# Patient Record
Sex: Female | Born: 1995 | Race: White | Hispanic: No | Marital: Married | Smoking: Former smoker
Health system: Southern US, Community
[De-identification: ages and names within clinical notes are randomized; demographics above are authoritative.]

## PROBLEM LIST (undated history)

## (undated) DIAGNOSIS — R519 Headache, unspecified: Secondary | ICD-10-CM

## (undated) DIAGNOSIS — M549 Dorsalgia, unspecified: Secondary | ICD-10-CM

## (undated) DIAGNOSIS — L309 Dermatitis, unspecified: Secondary | ICD-10-CM

## (undated) DIAGNOSIS — F419 Anxiety disorder, unspecified: Secondary | ICD-10-CM

## (undated) DIAGNOSIS — R748 Abnormal levels of other serum enzymes: Secondary | ICD-10-CM

## (undated) DIAGNOSIS — B999 Unspecified infectious disease: Secondary | ICD-10-CM

## (undated) DIAGNOSIS — F410 Panic disorder [episodic paroxysmal anxiety] without agoraphobia: Secondary | ICD-10-CM

## (undated) DIAGNOSIS — G43909 Migraine, unspecified, not intractable, without status migrainosus: Secondary | ICD-10-CM

## (undated) HISTORY — PX: NO PAST SURGERIES: SHX2092

---
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
## 2015-01-23 ENCOUNTER — Emergency Department (HOSPITAL_COMMUNITY): Payer: Managed Care, Other (non HMO)

## 2015-01-23 ENCOUNTER — Encounter (HOSPITAL_COMMUNITY): Payer: Self-pay | Admitting: Emergency Medicine

## 2015-01-23 ENCOUNTER — Emergency Department (HOSPITAL_COMMUNITY)
Admission: EM | Admit: 2015-01-23 | Discharge: 2015-01-23 | Disposition: A | Discharge status: Home / Self Care | Payer: Managed Care, Other (non HMO) | Attending: Emergency Medicine | Admitting: Emergency Medicine

## 2015-01-23 DIAGNOSIS — O9989 Other specified diseases and conditions complicating pregnancy, childbirth and the puerperium: Secondary | ICD-10-CM | POA: Insufficient documentation

## 2015-01-23 DIAGNOSIS — R1030 Lower abdominal pain, unspecified: Secondary | ICD-10-CM | POA: Insufficient documentation

## 2015-01-23 DIAGNOSIS — Z79899 Other long term (current) drug therapy: Secondary | ICD-10-CM | POA: Diagnosis not present

## 2015-01-23 DIAGNOSIS — R102 Pelvic and perineal pain: Secondary | ICD-10-CM

## 2015-01-23 DIAGNOSIS — J45909 Unspecified asthma, uncomplicated: Secondary | ICD-10-CM | POA: Diagnosis not present

## 2015-01-23 DIAGNOSIS — O99511 Diseases of the respiratory system complicating pregnancy, first trimester: Secondary | ICD-10-CM | POA: Diagnosis not present

## 2015-01-23 DIAGNOSIS — Z7952 Long term (current) use of systemic steroids: Secondary | ICD-10-CM | POA: Diagnosis not present

## 2015-01-23 DIAGNOSIS — R1110 Vomiting, unspecified: Secondary | ICD-10-CM

## 2015-01-23 DIAGNOSIS — Z3A01 Less than 8 weeks gestation of pregnancy: Secondary | ICD-10-CM | POA: Diagnosis not present

## 2015-01-23 DIAGNOSIS — O26899 Other specified pregnancy related conditions, unspecified trimester: Secondary | ICD-10-CM

## 2015-01-23 DIAGNOSIS — O219 Vomiting of pregnancy, unspecified: Secondary | ICD-10-CM | POA: Insufficient documentation

## 2015-01-23 DIAGNOSIS — Z880 Allergy status to penicillin: Secondary | ICD-10-CM | POA: Diagnosis not present

## 2015-01-23 LAB — COMPREHENSIVE METABOLIC PANEL
ALBUMIN: 4 g/dL (ref 3.5–5.0)
ALT: 21 U/L (ref 14–54)
ANION GAP: 8 (ref 5–15)
AST: 20 U/L (ref 15–41)
Alkaline Phosphatase: 76 U/L (ref 38–126)
BILIRUBIN TOTAL: 0.7 mg/dL (ref 0.3–1.2)
BUN: 8 mg/dL (ref 6–20)
CO2: 22 mmol/L (ref 22–32)
Calcium: 9 mg/dL (ref 8.9–10.3)
Chloride: 106 mmol/L (ref 101–111)
Creatinine, Ser: 0.55 mg/dL (ref 0.44–1.00)
GFR calc Af Amer: >60 mL/min (ref >60)
GFR calc non Af Amer: >60 mL/min (ref >60)
GLUCOSE: 81 mg/dL (ref 65–99)
POTASSIUM: 3.8 mmol/L (ref 3.5–5.1)
SODIUM: 136 mmol/L (ref 135–145)
TOTAL PROTEIN: 7.3 g/dL (ref 6.5–8.1)

## 2015-01-23 LAB — CBC WITH DIFFERENTIAL/PLATELET
BASOS PCT: 0 %
Basophils Absolute: 0 10*3/uL (ref 0.0–0.1)
EOS ABS: 0.4 10*3/uL (ref 0.0–0.7)
EOS PCT: 4 %
HEMATOCRIT: 41 % (ref 36.0–46.0)
Hemoglobin: 14.1 g/dL (ref 12.0–15.0)
Lymphocytes Relative: 27 %
Lymphs Abs: 2.7 10*3/uL (ref 0.7–4.0)
MCH: 28.8 pg (ref 26.0–34.0)
MCHC: 34.4 g/dL (ref 30.0–36.0)
MCV: 83.8 fL (ref 78.0–100.0)
MONO ABS: 0.6 10*3/uL (ref 0.1–1.0)
MONOS PCT: 6 %
Neutro Abs: 6.2 10*3/uL (ref 1.7–7.7)
Neutrophils Relative %: 63 %
PLATELETS: 299 10*3/uL (ref 150–400)
RBC: 4.89 MIL/uL (ref 3.87–5.11)
RDW: 12.9 % (ref 11.5–15.5)
WBC: 9.9 10*3/uL (ref 4.0–10.5)

## 2015-01-23 LAB — URINALYSIS, ROUTINE W REFLEX MICROSCOPIC
Bilirubin Urine: NEGATIVE
Glucose, UA: NEGATIVE mg/dL
KETONES UR: NEGATIVE mg/dL
NITRITE: NEGATIVE
PH: 6 (ref 5.0–8.0)
PROTEIN: NEGATIVE mg/dL
Specific Gravity, Urine: 1.02 (ref 1.005–1.030)
Urobilinogen, UA: 0.2 mg/dL (ref 0.0–1.0)

## 2015-01-23 LAB — HCG, QUANTITATIVE, PREGNANCY: hCG, Beta Chain, Quant, S: 33142 m[IU]/mL — ABNORMAL HIGH (ref <5)

## 2015-01-23 LAB — URINE MICROSCOPIC-ADD ON

## 2015-01-23 LAB — PREGNANCY, URINE: Preg Test, Ur: POSITIVE — AB

## 2015-01-23 LAB — LIPASE, BLOOD: Lipase: 21 U/L (ref 11–51)

## 2015-01-23 MED ORDER — PROMETHAZINE HCL 25 MG/ML IJ SOLN
12.5000 mg | Freq: Four times a day (QID) | INTRAMUSCULAR | Status: DC | PRN
  Administered 2015-01-23: 12.5 mg via INTRAVENOUS
  Filled 2015-01-23: qty 1

## 2015-01-23 MED ORDER — SODIUM CHLORIDE 0.9 % IV BOLUS (SEPSIS)
1000.0000 mL | Freq: Once | INTRAVENOUS | Status: AC
  Administered 2015-01-23: 1000 mL via INTRAVENOUS

## 2015-01-23 MED ORDER — PRENATAL COMPLETE 14-0.4 MG PO TABS: 1.0000 | ORAL_TABLET | Freq: Every day | ORAL | Status: DC

## 2015-01-23 MED ORDER — DOXYLAMINE-PYRIDOXINE 10-10 MG PO TBEC: 2.0000 | DELAYED_RELEASE_TABLET | Freq: Every evening | ORAL | Status: DC | PRN

## 2015-01-23 NOTE — ED Notes (Signed)
Started with vomiting last Wednesday.  Vomited 2-3 times in last 24 hours.

## 2015-01-23 NOTE — ED Notes (Signed)
Pt tolerating PO fluids at this time.

## 2015-01-23 NOTE — Discharge Instructions (Signed)
Nausea and Vomiting Follow-up with the obstetrician. Take nausea medicine as prescribed. Return to the ED if you develop new or worsening symptoms. Nausea is a sick feeling that often comes before throwing up (vomiting). Vomiting is a reflex where stomach contents come out of your mouth. Vomiting can cause severe loss of body fluids (dehydration). Children and elderly adults can become dehydrated quickly, especially if they also have diarrhea. Nausea and vomiting are symptoms of a condition or disease. It is important to find the cause of your symptoms. CAUSES   Direct irritation of the stomach lining. This irritation can result from increased acid production (gastroesophageal reflux disease), infection, food poisoning, taking certain medicines (such as nonsteroidal anti-inflammatory drugs), alcohol use, or tobacco use.  Signals from the brain.These signals could be caused by a headache, heat exposure, an inner ear disturbance, increased pressure in the brain from injury, infection, a tumor, or a concussion, pain, emotional stimulus, or metabolic problems.  An obstruction in the gastrointestinal tract (bowel obstruction).  Illnesses such as diabetes, hepatitis, gallbladder problems, appendicitis, kidney problems, cancer, sepsis, atypical symptoms of a heart attack, or eating disorders.  Medical treatments such as chemotherapy and radiation.  Receiving medicine that makes you sleep (general anesthetic) during surgery. DIAGNOSIS Your caregiver may ask for tests to be done if the problems do not improve after a few days. Tests may also be done if symptoms are severe or if the reason for the nausea and vomiting is not clear. Tests may include:  Urine tests.  Blood tests.  Stool tests.  Cultures (to look for evidence of infection).  X-rays or other imaging studies. Test results can help your caregiver make decisions about treatment or the need for additional tests. TREATMENT You need to  stay well hydrated. Drink frequently but in small amounts.You may wish to drink water, sports drinks, clear broth, or eat frozen ice pops or gelatin dessert to help stay hydrated.When you eat, eating slowly may help prevent nausea.There are also some antinausea medicines that may help prevent nausea. HOME CARE INSTRUCTIONS   Take all medicine as directed by your caregiver.  If you do not have an appetite, do not force yourself to eat. However, you must continue to drink fluids.  If you have an appetite, eat a normal diet unless your caregiver tells you differently.  Eat a variety of complex carbohydrates (rice, wheat, potatoes, bread), lean meats, yogurt, fruits, and vegetables.  Avoid high-fat foods because they are more difficult to digest.  Drink enough water and fluids to keep your urine clear or pale yellow.  If you are dehydrated, ask your caregiver for specific rehydration instructions. Signs of dehydration may include:  Severe thirst.  Dry lips and mouth.  Dizziness.  Dark urine.  Decreasing urine frequency and amount.  Confusion.  Rapid breathing or pulse. SEEK IMMEDIATE MEDICAL CARE IF:   You have blood or brown flecks (like coffee grounds) in your vomit.  You have black or bloody stools.  You have a severe headache or stiff neck.  You are confused.  You have severe abdominal pain.  You have chest pain or trouble breathing.  You do not urinate at least once every 8 hours.  You develop cold or clammy skin.  You continue to vomit for longer than 24 to 48 hours.  You have a fever. MAKE SURE YOU:   Understand these instructions.  Will watch your condition.  Will get help right away if you are not doing well or get worse.  This information is not intended to replace advice given to you by your health care provider. Make sure you discuss any questions you have with your health care provider.   Document Released: 03/11/2005 Document Revised:  06/03/2011 Document Reviewed: 08/08/2010 Elsevier Interactive Patient Education [employer]2016 [employer] Inc.

## 2015-01-23 NOTE — ED Provider Notes (Signed)
CSN: [id number]     Arrival date & time 01/23/15  1138 History   First MD Initiated Contact with Patient 01/23/15 1456     Chief Complaint  Patient presents with  . Emesis     (Consider location/radiation/quality/duration/timing/severity/associated sxs/prior Treatment) HPI Comments: Patient from home with 3 day history of nausea and vomiting morning. States she vomits 1-2 times in the morning and then feels better throughout the day but has a poor appetite. Last episode of vomiting was this morning. Associated with intermittent lower abdominal cramps. No diarrhea or fever. No urinary symptoms. No vaginal bleeding. Last menstrual period September 24. She is not certain if she is pregnant. No previous abdominal surgeries. No sick contacts or recent travel. No chest pain or shortness of breath.  Patient is a 19 y.o. female presenting with vomiting. The history is provided by the patient and a relative.  Emesis Associated symptoms: abdominal pain   Associated symptoms: no arthralgias, no headaches and no myalgias     Past Medical History  Diagnosis Date  . Asthma    History reviewed. No pertinent past surgical history. History reviewed. No pertinent family history. Social History  Substance Use Topics  . Smoking status: Never Smoker   . Smokeless tobacco: None  . Alcohol Use: No   OB History    No data available     Review of Systems  Constitutional: Positive for activity change and appetite change. Negative for fever.  HENT: Negative for congestion.   Eyes: Negative for visual disturbance.  Respiratory: Negative for cough, chest tightness and shortness of breath.   Gastrointestinal: Positive for nausea, vomiting and abdominal pain.  Genitourinary: Negative for dysuria, hematuria, vaginal bleeding and vaginal discharge.  Musculoskeletal: Negative for myalgias and arthralgias.  Skin: Negative for rash.  Neurological: Negative for dizziness, weakness and headaches.  A complete  10 system review of systems was obtained and all systems are negative except as noted in the HPI and PMH.      Allergies  Penicillins  Home Medications   Prior to Admission medications   Medication Sig Start Date End Date Taking? Authorizing Provider  albuterol (PROVENTIL,VENTOLIN) 90 MCG/ACT inhaler Inhale 1-4 puffs into the lungs every 6 (six) hours as needed. Shortness of breath and wheezing     Yes Historical Provider, MD  beclomethasone (QVAR) 80 MCG/ACT inhaler Inhale 1 puff into the lungs daily.   Yes Historical Provider, MD  flintstones complete (FLINTSTONES) 60 MG chewable tablet Chew 1 tablet by mouth daily.   Yes Historical Provider, MD  norelgestromin-ethinyl estradiol (ORTHO EVRA) 150-35 MCG/24HR transdermal patch Place 1 patch onto the skin once a week.   Yes Historical Provider, MD  azithromycin (ZITHROMAX Z-PAK) 250 MG tablet 2 tablets 1st day then 1 a day Patient not taking: Reported on 01/23/2015 07/05/12   Elson AreasLeslie K Sofia, PA-C  Doxylamine-Pyridoxine (DICLEGIS) 10-10 MG TBEC Take 2 tablets by mouth at bedtime as needed. 01/23/15   Glynn OctaveStephen Andrej Spagnoli, MD  naproxen (NAPROSYN) 500 MG tablet Take 1 tablet (500 mg total) by mouth 2 (two) times daily. Patient not taking: Reported on 01/23/2015 09/04/14   Janne NapoleonHope M Neese, NP  Prenatal Vit-Fe Fumarate-FA (PRENATAL COMPLETE) 14-0.4 MG TABS Take 1 tablet by mouth daily. 01/23/15   Glynn OctaveStephen Quinley Nesler, MD   BP 96/65 mmHg  Pulse 68  Temp(Src) 98.2 F (36.8 C) (Oral)  Resp 18  Ht 5\' 2"  (1.575 m)  Wt 147 lb 9 oz (66.934 kg)  BMI 26.98 kg/m2  SpO2 100%  LMP 12/18/2014 Physical Exam  Constitutional: She is oriented to person, place, and time. She appears well-developed and well-nourished. No distress.  Well appearing  HENT:  Head: Normocephalic and atraumatic.  Mouth/Throat: Oropharynx is clear and moist. No oropharyngeal exudate.  Eyes: Conjunctivae and EOM are normal. Pupils are equal, round, and reactive to light.  Neck: Normal  range of motion. Neck supple.  No meningismus.  Cardiovascular: Normal rate, regular rhythm, normal heart sounds and intact distal pulses.   No murmur heard. Pulmonary/Chest: Effort normal and breath sounds normal. No respiratory distress.  Abdominal: Soft. There is tenderness. There is no rebound and no guarding.  Mild lower Abdominal tenderness  Musculoskeletal: Normal range of motion. She exhibits no edema or tenderness.  Neurological: She is alert and oriented to person, place, and time. No cranial nerve deficit. She exhibits normal muscle tone. Coordination normal.  No ataxia on finger to nose bilaterally. No pronator drift. 5/5 strength throughout. CN 2-12 intact. Negative Romberg. Equal grip strength. Sensation intact. Gait is normal.   Skin: Skin is warm.  Psychiatric: She has a normal mood and affect. Her behavior is normal.  Nursing note and vitals reviewed.   ED Course  Procedures (including critical care time) Labs Review Labs Reviewed  URINALYSIS, ROUTINE W REFLEX MICROSCOPIC (NOT AT [facility]) - Abnormal; Notable for the following:    Hgb urine dipstick SMALL (*)    Leukocytes, UA TRACE (*)    All other components within normal limits  PREGNANCY, URINE - Abnormal; Notable for the following:    Preg Test, Ur POSITIVE (*)    All other components within normal limits  URINE MICROSCOPIC-ADD ON - Abnormal; Notable for the following:    Squamous Epithelial / LPF MANY (*)    All other components within normal limits  HCG, QUANTITATIVE, PREGNANCY - Abnormal; Notable for the following:    hCG, Beta Chain, Quant, S 33142 (*)    All other components within normal limits  URINE CULTURE  CBC WITH DIFFERENTIAL/PLATELET  COMPREHENSIVE METABOLIC PANEL  LIPASE, BLOOD    Imaging Review US Ob Comp Less 14 Wks  01/23/2015  CLINICAL DATA:  Pelvic pain and vomiting for the past 5 days. Positive urine pregnancy test. EXAM: OBSTETRIC <14 WK [place] AND TRANSVAGINAL OB US TECHNIQUE: Both  transabdominal and transvaginal ultrasound examinations were performed for complete evaluation of the gestation as well as the maternal uterus, adnexal regions, and pelvic cul-de-sac. Transvaginal technique was performed to assess early pregnancy. COMPARISON:  None. FINDINGS: Intrauterine gestational sac: Visualized/normal in shape. Yolk sac:  Visualized/ normal in shape. Embryo:  Visualized Cardiac Activity: Visualized Heart Rate: 162  bpm CRL:  3.3  mm   6 w   0 d                  [place] EDC: 09/18/2015 Maternal uterus/adnexae: No subchorionic hemorrhage. Normal appearing ovaries. No free peritoneal fluid. IMPRESSION: Single live intrauterine gestation with an estimated gestational age of [redacted] weeks and 0 days. No complicating features. Electronically Signed   By: Beckie Salts M.D.   On: 01/23/2015 16:24   US Ob Transvaginal  01/23/2015  CLINICAL DATA:  Pelvic pain and vomiting for the past 5 days. Positive urine pregnancy test. EXAM: OBSTETRIC <14 WK [place] AND TRANSVAGINAL OB US TECHNIQUE: Both transabdominal and transvaginal ultrasound examinations were performed for complete evaluation of the gestation as well as the maternal uterus, adnexal regions, and pelvic cul-de-sac. Transvaginal technique was performed to assess early pregnancy. COMPARISON:  None. FINDINGS: Intrauterine gestational sac: Visualized/normal in shape. Yolk sac:  Visualized/ normal in shape. Embryo:  Visualized Cardiac Activity: Visualized Heart Rate: 162  bpm CRL:  3.3  mm   6 w   0 d                  [place] EDC: 09/18/2015 Maternal uterus/adnexae: No subchorionic hemorrhage. Normal appearing ovaries. No free peritoneal fluid. IMPRESSION: Single live intrauterine gestation with an estimated gestational age of [redacted] weeks and 0 days. No complicating features. Electronically Signed   By: Beckie Salts M.D.   On: 01/23/2015 16:24   I have personally reviewed and evaluated these images and lab results as part of my medical decision-making.   EKG  Interpretation None      MDM   Final diagnoses:  Nausea and vomiting during pregnancy   Nausea and vomiting with minimal lower abdominal cramping.  Found to be pregnant.  No abdominal tenderness now.  UA appears contaminated with squamous cells. Labs are within normal limits.electrolytes are normal. Patient given IV fluids and antiemetics.  Ultrasound shows IUP at [redacted] weeks gestation without complicating features. Patient with no vomiting in the ED. She is tolerating by mouth. Needs to establish care with OB. Start prenatal vitamin and antiemetics. Return precautions discussed.    Glynn Octave, MD 01/24/15 [phone]

## 2015-01-24 LAB — URINE CULTURE

## 2015-02-14 LAB — OB RESULTS CONSOLE GC/CHLAMYDIA: Chlamydia: NEGATIVE

## 2015-02-14 LAB — OB RESULTS CONSOLE RPR: RPR: NONREACTIVE

## 2015-02-14 LAB — OB RESULTS CONSOLE ANTIBODY SCREEN: Antibody Screen: NEGATIVE

## 2015-02-14 LAB — OB RESULTS CONSOLE ABO/RH: RH Type: NEGATIVE

## 2015-02-14 LAB — OB RESULTS CONSOLE HEPATITIS B SURFACE ANTIGEN: Hepatitis B Surface Ag: NEGATIVE

## 2015-02-14 LAB — OB RESULTS CONSOLE RUBELLA ANTIBODY, IGM: Rubella: IMMUNE

## 2015-02-14 LAB — OB RESULTS CONSOLE GBS: STREP GROUP B AG: POSITIVE

## 2015-02-14 LAB — OB RESULTS CONSOLE HIV ANTIBODY (ROUTINE TESTING): HIV: NONREACTIVE

## 2015-03-26 NOTE — L&D Delivery Note (Signed)
Delivery Note At 3:57 PM a viable female was delivered via Vaginal, Spontaneous Delivery (Presentation: Middle Occiput Posterior).  APGAR: 4, 9; weight 7 lb 0.5 oz (3190 g).   Placenta status: Intact, Spontaneous.  Cord: 3 vessels with the following complications: None.  Cord pH: sent/pending Tight nuchal cord X 1 , clapmed + cut, ambu bag/O2 resp assist until neo arrived>>AP 4/9  Anesthesia: Epidural  Episiotomy: None Lacerations: 2nd degree, rectum + sphincter intact Suture Repair: 3.0 vicryl rapide Est. Blood Loss (mL):  350  Mom to postpartum.  Baby to Couplet care / Skin to Skin.  Meriel PicaHOLLAND,Jeraldin Fesler M 09/18/2015, 4:24 PM

## 2015-09-15 ENCOUNTER — Inpatient Hospital Stay (HOSPITAL_COMMUNITY): Payer: Self-pay

## 2015-09-15 ENCOUNTER — Inpatient Hospital Stay (HOSPITAL_COMMUNITY): Admission: AD | Admit: 2015-09-15 | Payer: Self-pay | Source: Ambulatory Visit | Admitting: Obstetrics & Gynecology

## 2015-09-18 ENCOUNTER — Encounter (HOSPITAL_COMMUNITY): Payer: Self-pay

## 2015-09-18 ENCOUNTER — Inpatient Hospital Stay (HOSPITAL_COMMUNITY)
Admission: RE | Admit: 2015-09-18 | Discharge: 2015-09-20 | DRG: 775 | Disposition: A | Discharge status: Home / Self Care | Payer: Managed Care, Other (non HMO) | Source: Ambulatory Visit | Attending: Obstetrics and Gynecology | Admitting: Obstetrics and Gynecology

## 2015-09-18 ENCOUNTER — Inpatient Hospital Stay (HOSPITAL_COMMUNITY): Payer: Managed Care, Other (non HMO) | Admitting: Anesthesiology

## 2015-09-18 ENCOUNTER — Inpatient Hospital Stay (HOSPITAL_COMMUNITY): Payer: Managed Care, Other (non HMO)

## 2015-09-18 DIAGNOSIS — Z3490 Encounter for supervision of normal pregnancy, unspecified, unspecified trimester: Secondary | ICD-10-CM

## 2015-09-18 DIAGNOSIS — O329XX0 Maternal care for malpresentation of fetus, unspecified, not applicable or unspecified: Secondary | ICD-10-CM

## 2015-09-18 DIAGNOSIS — J45909 Unspecified asthma, uncomplicated: Secondary | ICD-10-CM | POA: Diagnosis present

## 2015-09-18 LAB — CBC
HEMATOCRIT: 34.5 % — AB (ref 36.0–46.0)
Hemoglobin: 11.7 g/dL — ABNORMAL LOW (ref 12.0–15.0)
MCH: 27.6 pg (ref 26.0–34.0)
MCHC: 33.9 g/dL (ref 30.0–36.0)
MCV: 81.4 fL (ref 78.0–100.0)
Platelets: 206 10*3/uL (ref 150–400)
RBC: 4.24 MIL/uL (ref 3.87–5.11)
RDW: 14.9 % (ref 11.5–15.5)
WBC: 8.6 10*3/uL (ref 4.0–10.5)

## 2015-09-18 LAB — RPR: RPR: NONREACTIVE

## 2015-09-18 MED ORDER — MEASLES, MUMPS & RUBELLA VAC ~~LOC~~ INJ: 0.5000 mL | INJECTION | Freq: Once | SUBCUTANEOUS | Status: DC

## 2015-09-18 MED ORDER — SENNOSIDES-DOCUSATE SODIUM 8.6-50 MG PO TABS: 2.0000 | ORAL_TABLET | ORAL | Status: DC

## 2015-09-18 MED ORDER — EPHEDRINE 5 MG/ML INJ
10.0000 mg | INTRAVENOUS | Status: DC | PRN
  Filled 2015-09-18: qty 2

## 2015-09-18 MED ORDER — BUTORPHANOL TARTRATE 1 MG/ML IJ SOLN
2.0000 mg | Freq: Once | INTRAMUSCULAR | Status: AC
  Administered 2015-09-18: 2 mg via INTRAVENOUS
  Filled 2015-09-18: qty 2

## 2015-09-18 MED ORDER — ZOLPIDEM TARTRATE 5 MG PO TABS: 5.0000 mg | ORAL_TABLET | Freq: Every evening | ORAL | Status: DC | PRN

## 2015-09-18 MED ORDER — DIPHENHYDRAMINE HCL 25 MG PO CAPS: 25.0000 mg | ORAL_CAPSULE | Freq: Four times a day (QID) | ORAL | Status: DC | PRN

## 2015-09-18 MED ORDER — ONDANSETRON HCL 4 MG/2ML IJ SOLN
4.0000 mg | Freq: Once | INTRAMUSCULAR | Status: DC
  Filled 2015-09-18: qty 2

## 2015-09-18 MED ORDER — LACTATED RINGERS IV SOLN: 500.0000 mL | Freq: Once | INTRAVENOUS | Status: DC

## 2015-09-18 MED ORDER — BUPIVACAINE HCL (PF) 0.25 % IJ SOLN
INTRAMUSCULAR | Status: DC | PRN
  Administered 2015-09-18 (×2): 4 mL via EPIDURAL

## 2015-09-18 MED ORDER — ACETAMINOPHEN 325 MG PO TABS: 650.0000 mg | ORAL_TABLET | ORAL | Status: DC | PRN

## 2015-09-18 MED ORDER — BENZOCAINE-MENTHOL 20-0.5 % EX AERO
1.0000 "application " | INHALATION_SPRAY | CUTANEOUS | Status: DC | PRN
  Administered 2015-09-18 – 2015-09-20 (×2): 1 via TOPICAL
  Filled 2015-09-18 (×2): qty 56

## 2015-09-18 MED ORDER — OXYTOCIN BOLUS FROM INFUSION: 500.0000 mL | INTRAVENOUS | Status: DC

## 2015-09-18 MED ORDER — LACTATED RINGERS IV SOLN
INTRAVENOUS | Status: DC
  Administered 2015-09-18: 01:00:00 via INTRAVENOUS

## 2015-09-18 MED ORDER — LIDOCAINE HCL (PF) 1 % IJ SOLN
30.0000 mL | INTRAMUSCULAR | Status: DC | PRN
  Filled 2015-09-18: qty 30

## 2015-09-18 MED ORDER — SIMETHICONE 80 MG PO CHEW: 80.0000 mg | CHEWABLE_TABLET | ORAL | Status: DC | PRN

## 2015-09-18 MED ORDER — DEXTROSE IN LACTATED RINGERS 5 % IV SOLN: INTRAVENOUS | Status: DC

## 2015-09-18 MED ORDER — ONDANSETRON HCL 4 MG/2ML IJ SOLN: 4.0000 mg | INTRAMUSCULAR | Status: DC | PRN

## 2015-09-18 MED ORDER — OXYCODONE-ACETAMINOPHEN 5-325 MG PO TABS: 2.0000 | ORAL_TABLET | ORAL | Status: DC | PRN

## 2015-09-18 MED ORDER — OXYCODONE-ACETAMINOPHEN 5-325 MG PO TABS: 1.0000 | ORAL_TABLET | ORAL | Status: DC | PRN

## 2015-09-18 MED ORDER — PHENYLEPHRINE 40 MCG/ML (10ML) SYRINGE FOR IV PUSH (FOR BLOOD PRESSURE SUPPORT)
80.0000 ug | PREFILLED_SYRINGE | INTRAVENOUS | Status: DC | PRN
  Administered 2015-09-18: 40 ug via INTRAVENOUS
  Filled 2015-09-18: qty 10
  Filled 2015-09-18: qty 5

## 2015-09-18 MED ORDER — COCONUT OIL OIL: 1.0000 "application " | TOPICAL_OIL | Status: DC | PRN

## 2015-09-18 MED ORDER — FLEET ENEMA 7-19 GM/118ML RE ENEM: 1.0000 | ENEMA | Freq: Every day | RECTAL | Status: DC | PRN

## 2015-09-18 MED ORDER — SOD CITRATE-CITRIC ACID 500-334 MG/5ML PO SOLN: 30.0000 mL | ORAL | Status: DC | PRN

## 2015-09-18 MED ORDER — TERBUTALINE SULFATE 1 MG/ML IJ SOLN
0.2500 mg | Freq: Once | INTRAMUSCULAR | Status: DC | PRN
  Filled 2015-09-18: qty 1

## 2015-09-18 MED ORDER — LACTATED RINGERS IV SOLN
500.0000 mL | INTRAVENOUS | Status: DC | PRN
  Administered 2015-09-18: 1000 mL via INTRAVENOUS

## 2015-09-18 MED ORDER — OXYCODONE-ACETAMINOPHEN 5-325 MG PO TABS
1.0000 | ORAL_TABLET | ORAL | Status: DC | PRN
Start: 2015-09-18 — End: 2015-09-18

## 2015-09-18 MED ORDER — DIBUCAINE 1 % RE OINT: 1.0000 "application " | TOPICAL_OINTMENT | RECTAL | Status: DC | PRN

## 2015-09-18 MED ORDER — TERBUTALINE SULFATE 1 MG/ML IJ SOLN: 0.2500 mg | Freq: Once | INTRAMUSCULAR | Status: DC | PRN

## 2015-09-18 MED ORDER — VANCOMYCIN HCL IN DEXTROSE 1-5 GM/200ML-% IV SOLN
1000.0000 mg | Freq: Two times a day (BID) | INTRAVENOUS | Status: DC
  Administered 2015-09-18: 1000 mg via INTRAVENOUS
  Filled 2015-09-18 (×2): qty 200

## 2015-09-18 MED ORDER — MISOPROSTOL 25 MCG QUARTER TABLET
25.0000 ug | ORAL_TABLET | ORAL | Status: DC | PRN
  Administered 2015-09-18: 25 ug via VAGINAL
  Filled 2015-09-18: qty 1
  Filled 2015-09-18: qty 0.25

## 2015-09-18 MED ORDER — TETANUS-DIPHTH-ACELL PERTUSSIS 5-2.5-18.5 LF-MCG/0.5 IM SUSP: 0.5000 mL | Freq: Once | INTRAMUSCULAR | Status: DC

## 2015-09-18 MED ORDER — WITCH HAZEL-GLYCERIN EX PADS: 1.0000 "application " | MEDICATED_PAD | CUTANEOUS | Status: DC | PRN

## 2015-09-18 MED ORDER — PHENYLEPHRINE 40 MCG/ML (10ML) SYRINGE FOR IV PUSH (FOR BLOOD PRESSURE SUPPORT)
80.0000 ug | PREFILLED_SYRINGE | INTRAVENOUS | Status: DC | PRN
  Filled 2015-09-18: qty 5

## 2015-09-18 MED ORDER — FLEET ENEMA 7-19 GM/118ML RE ENEM: 1.0000 | ENEMA | RECTAL | Status: DC | PRN

## 2015-09-18 MED ORDER — BUDESONIDE 0.25 MG/2ML IN SUSP: 0.2500 mg | RESPIRATORY_TRACT | Status: DC | PRN

## 2015-09-18 MED ORDER — ALBUTEROL SULFATE (2.5 MG/3ML) 0.083% IN NEBU: 3.0000 mL | INHALATION_SOLUTION | Freq: Four times a day (QID) | RESPIRATORY_TRACT | Status: DC | PRN

## 2015-09-18 MED ORDER — ONDANSETRON HCL 4 MG PO TABS: 4.0000 mg | ORAL_TABLET | ORAL | Status: DC | PRN

## 2015-09-18 MED ORDER — OXYTOCIN 40 UNITS IN LACTATED RINGERS INFUSION - SIMPLE MED
2.5000 [IU]/h | INTRAVENOUS | Status: DC
  Administered 2015-09-18: 39.96 [IU]/h via INTRAVENOUS
  Filled 2015-09-18: qty 1000

## 2015-09-18 MED ORDER — DIPHENHYDRAMINE HCL 50 MG/ML IJ SOLN: 12.5000 mg | INTRAMUSCULAR | Status: DC | PRN

## 2015-09-18 MED ORDER — LIDOCAINE-EPINEPHRINE (PF) 1.5 %-1:200000 IJ SOLN
INTRAMUSCULAR | Status: DC | PRN
  Administered 2015-09-18: 3 mL via EPIDURAL

## 2015-09-18 MED ORDER — FENTANYL 2.5 MCG/ML BUPIVACAINE 1/10 % EPIDURAL INFUSION (WH - ANES)
14.0000 mL/h | INTRAMUSCULAR | Status: DC | PRN
  Administered 2015-09-18: 12 mL/h via EPIDURAL
  Administered 2015-09-18: 14 mL/h via EPIDURAL
  Filled 2015-09-18: qty 125

## 2015-09-18 MED ORDER — BISACODYL 10 MG RE SUPP: 10.0000 mg | Freq: Every day | RECTAL | Status: DC | PRN

## 2015-09-18 MED ORDER — IBUPROFEN 800 MG PO TABS
800.0000 mg | ORAL_TABLET | Freq: Three times a day (TID) | ORAL | Status: DC | PRN
  Administered 2015-09-18 – 2015-09-19 (×4): 800 mg via ORAL
  Filled 2015-09-18 (×4): qty 1

## 2015-09-18 MED ORDER — PRENATAL MULTIVITAMIN CH
1.0000 | ORAL_TABLET | Freq: Every day | ORAL | Status: DC
  Administered 2015-09-19 – 2015-09-20 (×2): 1 via ORAL
  Filled 2015-09-18 (×2): qty 1

## 2015-09-18 NOTE — Progress Notes (Signed)
Consulted MD pertaining to patient pain and positioning of fetus. Per MD pt able to receive Stadol 2mg . Per MD hold second dose of Cytotec due to uncertainty of positioning of fetus. Pt possible ROM at 0530 MD aware.

## 2015-09-18 NOTE — Anesthesia Pain Management Evaluation Note (Signed)
  CRNA Pain Management Visit Note  Patient: Nichole Curtis, 20 y.o., female  "Hello I am a [facility] of the anesthesia team at [facility]. We have an anesthesia team available at all times to provide care throughout the hospital, including epidural management and anesthesia for C-section. I don't know your plan for the delivery whether it a natural birth, water birth, IV sedation, nitrous supplementation, doula or epidural, but we want to meet your pain goals."   1.Was your pain managed to your expectations on prior hospitalizations?   No prior hospitalizations  2.What is your expectation for pain management during this hospitalization?     IV pain meds  3.How can we help you reach that goal? IV Pain Rx   Record the patient's initial score and the patient's pain goal.   Pain: 2  Pain Goal: 6 [facility] wants you to be able to say your pain was always managed very well.  Janiqua Friscia 09/18/2015

## 2015-09-18 NOTE — Anesthesia Procedure Notes (Signed)
Epidural Patient location during procedure: OB  Staffing Anesthesiologist: Sopheap Boehle Performed by: anesthesiologist   Preanesthetic Checklist Completed: patient identified, surgical consent, pre-op evaluation, timeout performed, IV checked, risks and benefits discussed and monitors and equipment checked  Epidural Patient position: sitting Prep: DuraPrep Patient monitoring: heart rate, cardiac monitor, continuous pulse ox and blood pressure Approach: midline Location: L3-L4 Injection technique: LOR saline  Needle:  Needle type: Tuohy  Needle gauge: 17 G Needle length: 9 cm Needle insertion depth: 6 cm Catheter type: closed end flexible Catheter size: 19 Gauge Catheter at skin depth: 12 cm Test dose: negative and 1.5% lidocaine with Epi 1:200 K  Assessment Events: blood not aspirated, injection not painful, no injection resistance, negative IV test and no paresthesia  Additional Notes Reason for block:procedure for pain

## 2015-09-18 NOTE — H&P (Signed)
Nichole Curtis is a 20 y.o. female presenting for IOL. Maternal Medical History:  Contractions: Onset was 1-2 hours ago.   Frequency: regular.   Perceived severity is moderate.    Fetal activity: Perceived fetal activity is normal.      OB History    Gravida Para Term Preterm AB TAB SAB Ectopic Multiple Living   1              Past Medical History  Diagnosis Date  . Asthma    No past surgical history on file. Family History: family history is not on file. Social History:  reports that she has never smoked. She does not have any smokeless tobacco history on file. She reports that she does not drink alcohol or use illicit drugs.   Prenatal Transfer Tool  Maternal Diabetes: No Genetic Screening: Normal Maternal Ultrasounds/Referrals: Normal Fetal Ultrasounds or other Referrals:  None Maternal Substance Abuse:  No Significant Maternal Medications:  None Significant Maternal Lab Results:  None Other Comments:  None  ROS  Dilation: 2.5 Effacement (%): 80 Station: -2 Exam by:: D Herr rn Blood pressure 142/81, pulse 50, temperature 97.8 F (36.6 C), temperature source Oral, resp. rate 20, height 5\' 2"  (1.575 m), weight 180 lb (81.647 kg), last menstrual period 12/18/2014, SpO2 97 %. Exam Physical Exam  Constitutional: She appears well-developed and well-nourished.  HENT:  Head: Normocephalic and atraumatic.  Neck: Normal range of motion. Neck supple.  Cardiovascular: Normal rate and regular rhythm.   Respiratory: Effort normal and breath sounds normal.  GI:  Term FH, FHR 142  Genitourinary:  3/C/vtx/ SROM  Musculoskeletal: Normal range of motion.  Skin: Skin is warm and dry.    Prenatal labs: ABO, Rh: --/--/O NEG (06/26 0115) Antibody: POS (06/26 0115) Rubella: Immune (11/22 0000) RPR: Nonreactive (11/22 0000)  HBsAg: Negative (11/22 0000)  HIV: Non-reactive (11/22 0000)  GBS: Positive (11/22 0000)   Assessment/Plan: Term preg, for IOL ABX for  GBS   Nichole Curtis M 09/18/2015, 8:06 AM

## 2015-09-18 NOTE — Progress Notes (Signed)
U/S in room , vertex position, occiput posterior

## 2015-09-18 NOTE — Anesthesia Preprocedure Evaluation (Signed)
Anesthesia Evaluation  Patient identified by MRN, date of birth, ID band Patient awake    Reviewed: Allergy & Precautions, Patient's Chart, lab work & pertinent test results  History of Anesthesia Complications Negative for: history of anesthetic complications  Airway Mallampati: II  TM Distance: >3 FB Neck ROM: Full    Dental  (+) Teeth Intact   Pulmonary asthma ,    breath sounds clear to auscultation- rhonchi       Cardiovascular negative cardio ROS   Rhythm:Regular     Neuro/Psych negative neurological ROS  negative psych ROS   GI/Hepatic negative GI ROS, Neg liver ROS,   Endo/Other  negative endocrine ROS  Renal/GU negative Renal ROS     Musculoskeletal   Abdominal   Peds  Hematology negative hematology ROS (+)   Anesthesia Other Findings   Reproductive/Obstetrics (+) Pregnancy                             Anesthesia Physical Anesthesia Plan  ASA: II  Anesthesia Plan: Epidural   Post-op Pain Management:    Induction:   Airway Management Planned:   Additional Equipment:   Intra-op Plan:   Post-operative Plan:   Informed Consent: I have reviewed the patients History and Physical, chart, labs and discussed the procedure including the risks, benefits and alternatives for the proposed anesthesia with the patient or authorized representative who has indicated his/her understanding and acceptance.     Plan Discussed with: Anesthesiologist  Anesthesia Plan Comments:         Anesthesia Quick Evaluation

## 2015-09-19 ENCOUNTER — Encounter (HOSPITAL_COMMUNITY): Payer: Self-pay

## 2015-09-19 LAB — CBC
HEMATOCRIT: 30.3 % — AB (ref 36.0–46.0)
Hemoglobin: 10 g/dL — ABNORMAL LOW (ref 12.0–15.0)
MCH: 26.7 pg (ref 26.0–34.0)
MCHC: 33 g/dL (ref 30.0–36.0)
MCV: 81 fL (ref 78.0–100.0)
Platelets: 167 10*3/uL (ref 150–400)
RBC: 3.74 MIL/uL — AB (ref 3.87–5.11)
RDW: 15 % (ref 11.5–15.5)
WBC: 9.8 10*3/uL (ref 4.0–10.5)

## 2015-09-19 MED ORDER — PNEUMOCOCCAL VAC POLYVALENT 25 MCG/0.5ML IJ INJ
0.5000 mL | INJECTION | INTRAMUSCULAR | Status: DC
  Filled 2015-09-19: qty 0.5

## 2015-09-19 NOTE — Anesthesia Postprocedure Evaluation (Signed)
Anesthesia Post Note  Patient: Minda DittoMegan N Vanrheen  Procedure(s) Performed: * No procedures listed *  Patient location during evaluation: Mother Baby Anesthesia Type: Epidural Level of consciousness: awake, awake and alert and oriented Pain management: pain level controlled Vital Signs Assessment: post-procedure vital signs reviewed and stable Respiratory status: spontaneous breathing, nonlabored ventilation and respiratory function stable Cardiovascular status: stable Postop Assessment: no headache, no backache, patient able to bend at knees, no signs of nausea or vomiting and adequate PO intake Anesthetic complications: no     Last Vitals:  Filed Vitals:   09/18/15 2232 09/19/15 0718  BP: 128/70 115/55  Pulse: 83 82  Temp: 36.9 C 36.8 C  Resp: 18 18    Last Pain:  Filed Vitals:   09/19/15 1038  PainSc: 0-No pain   Pain Goal: Patients Stated Pain Goal: 3 (09/19/15 0451)               Deairra Halleck

## 2015-09-19 NOTE — Progress Notes (Signed)
Post Partum Day 1 Subjective: no complaints, up ad lib, voiding and tolerating PO  Objective: Blood pressure 115/55, pulse 82, temperature 98.3 F (36.8 C), temperature source Oral, resp. rate 18, height 5\' 2"  (1.575 m), weight 180 lb (81.647 kg), last menstrual period 12/18/2014, SpO2 97 %, unknown if currently breastfeeding.  Physical Exam:  General: alert and cooperative Lochia: appropriate Uterine Fundus: firm Incision: healing well, labial edema noted L> R DVT Evaluation: No evidence of DVT seen on physical exam. Negative Homan's sign. No cords or calf tenderness.   Recent Labs  09/18/15 0115 09/19/15 0550  HGB 11.7* 10.0*  HCT 34.5* 30.3*    Assessment/Plan: Plan for discharge tomorrow   LOS: 1 day   Nichole Curtis G 09/19/2015, 8:01 AM

## 2015-09-19 NOTE — Lactation Note (Signed)
This note was copied from a baby's chart. Lactation Consultation Note New mom BF in side lying position. RN assisted in latch. Mom has large pendulum breast w/flat nipples. Compressible for deep latch when baby holds on to nipple. Baby actively suckling well on breast. Has good flange. Mom denies painful latch. Positioned mom to lay back away from baby w/pillows supporting her back so not to cover baby's nose. Discussed safety during BF. Hand expressed w/easy flow of colostrum. Mom reports + breast changes w/pregnancy. Shells given to wear in bra to assist everting nipples. Hand pump given to evert nipples prior to latch also for stimulation. Educated about newborn behavior, STS, I&O, cluster feeding, supply and demand. Mom encouraged to feed baby 8-12 times/24 hours and with feeding cues. Referred to Baby and Me Book in Breastfeeding section Pg. 22-23 for position options and Proper latch demonstration.[facility] brochure given w/resources, support groups and LC services. Patient Name: Nichole Vedia PereyraMegan Vanrheen ZOXWR'UToday's Date: 09/19/2015 Reason for consult: Initial assessment   Maternal Data Has patient been taught Hand Expression?: Yes Does the patient have breastfeeding experience prior to this delivery?: No  Feeding Feeding Type: Breast Fed Length of feed: 20 min  LATCH Score/Interventions Latch: Grasps breast easily, tongue down, lips flanged, rhythmical sucking. Intervention(s): Adjust position;Assist with latch;Breast massage;Breast compression  Audible Swallowing: A few with stimulation Intervention(s): Hand expression;Alternate breast massage  Type of Nipple: Flat Intervention(s): Hand pump;Shells  Comfort (Breast/Nipple): Soft / non-tender     Hold (Positioning): Assistance needed to correctly position infant at breast and maintain latch. Intervention(s): Skin to skin;Position options;Support Pillows;Breastfeeding basics reviewed  LATCH Score: 7  Lactation Tools Discussed/Used Tools:  Shells;Pump Shell Type: Inverted Breast pump type: Manual WIC Program: Yes Pump Review: Setup, frequency, and cleaning;Milk Storage Initiated by:: Peri JeffersonL. Johntay Doolen RN Date initiated:: 09/19/15   Consult Status Date: 09/19/15 (in pm) Follow-up type: In-patient    Duanne Duchesne, Diamond NickelLAURA G 09/19/2015, 2:52 AM

## 2015-09-20 MED ORDER — IBUPROFEN 800 MG PO TABS: 800.0000 mg | ORAL_TABLET | Freq: Three times a day (TID) | ORAL | Status: DC | PRN

## 2015-09-20 MED ORDER — OXYCODONE-ACETAMINOPHEN 5-325 MG PO TABS: 1.0000 | ORAL_TABLET | ORAL | Status: DC | PRN

## 2015-09-20 NOTE — Discharge Instructions (Signed)
Call MD for T>100.4, heavy vaginal bleeding, severe abdominal pain, or respiratory distress.  Call office to schedule postpartum visit in 4-6 weeks.  No driving while taking narcotics.  Pelvic rest x 6 weeks. °

## 2015-09-20 NOTE — Discharge Summary (Signed)
Obstetric Discharge Summary Reason for Admission: induction of labor Prenatal Procedures: ultrasound Intrapartum Procedures: spontaneous vaginal delivery Postpartum Procedures: none Complications-Operative and Postpartum: 2 degree perineal laceration HEMOGLOBIN  Date Value Ref Range Status  09/19/2015 10.0* 12.0 - 15.0 g/dL Final   HCT  Date Value Ref Range Status  09/19/2015 30.3* 36.0 - 46.0 % Final    Physical Exam:  General: alert and cooperative Lochia: appropriate Uterine Fundus: firm Incision: healing well DVT Evaluation: No evidence of DVT seen on physical exam. Negative Homan's sign. No cords or calf tenderness. No significant calf/ankle edema.  Discharge Diagnoses: Term Pregnancy-delivered  Discharge Information: Date: 09/20/2015 Activity: pelvic rest Diet: routine Medications: PNV and Ibuprofen Condition: stable Instructions: refer to practice specific booklet Discharge to: home   Newborn Data: Live born female  Birth Weight: 7 lb 0.5 oz (3190 g) APGAR: 4, 9  Home with mother.  CURTIS,CAROL G 09/20/2015, 8:25 AM

## 2015-09-20 NOTE — Lactation Note (Signed)
This note was copied from a baby's chart. Lactation Consultation Note    Follow up visit made prior to discharge.  Mom states baby is latching well to breast.  She has been cluster feeding this AM.  Reviewed discharge instructions including engorgement treatment.  Recommended parents keep a feeding diary of feeds and output this first week home.  Lactation outpatient services and support reviewed and encouraged.  Patient Name: Girl Vedia PereyraMegan Vanrheen ZOXWR'UToday's Date: 09/20/2015     Maternal Data    Feeding Feeding Type: Breast Fed Length of feed: 30 min  LATCH Score/Interventions                      Lactation Tools Discussed/Used     Consult Status      Huston FoleyMOULDEN, Chrsitopher Wik S 09/20/2015, 8:54 AM

## 2015-09-20 NOTE — Progress Notes (Signed)
Assumed care of mom and baby.  Mom skin to skin with baby.  Family at bedside.

## 2015-09-20 NOTE — Progress Notes (Signed)
Post Partum Day 2 Subjective: no complaints, up ad lib, voiding, tolerating PO and + flatus  Objective: Blood pressure 137/80, pulse 67, temperature 98.2 F (36.8 C), temperature source Oral, resp. rate 18, height 5\' 2"  (1.575 m), weight 180 lb (81.647 kg), last menstrual period 12/18/2014, SpO2 97 %, unknown if currently breastfeeding.  Physical Exam:  General: alert and cooperative Lochia: appropriate Uterine Fundus: firm Incision: healing well DVT Evaluation: No evidence of DVT seen on physical exam. Negative Homan's sign. No cords or calf tenderness. Calf/Ankle edema is present.   Recent Labs  09/18/15 0115 09/19/15 0550  HGB 11.7* 10.0*  HCT 34.5* 30.3*    Assessment/Plan: Discharge home, unable to complete discharge orders, admission orders have not been signed.   LOS: 2 days   CURTIS,CAROL G 09/20/2015, 8:26 AM

## 2015-09-21 LAB — TYPE AND SCREEN
ABO/RH(D): O NEG
ANTIBODY SCREEN: POSITIVE
DAT, IgG: NEGATIVE
UNIT DIVISION: 0
UNIT DIVISION: 0

## 2016-05-13 DIAGNOSIS — F418 Other specified anxiety disorders: Secondary | ICD-10-CM | POA: Insufficient documentation

## 2016-06-22 ENCOUNTER — Emergency Department (HOSPITAL_COMMUNITY)
Admission: EM | Admit: 2016-06-22 | Discharge: 2016-06-22 | Disposition: A | Discharge status: Home / Self Care | Payer: BLUE CROSS/BLUE SHIELD | Attending: Emergency Medicine | Admitting: Emergency Medicine

## 2016-06-22 ENCOUNTER — Encounter (HOSPITAL_COMMUNITY): Payer: Self-pay | Admitting: *Deleted

## 2016-06-22 DIAGNOSIS — J039 Acute tonsillitis, unspecified: Secondary | ICD-10-CM | POA: Diagnosis not present

## 2016-06-22 DIAGNOSIS — J45909 Unspecified asthma, uncomplicated: Secondary | ICD-10-CM | POA: Diagnosis not present

## 2016-06-22 DIAGNOSIS — F17210 Nicotine dependence, cigarettes, uncomplicated: Secondary | ICD-10-CM | POA: Insufficient documentation

## 2016-06-22 DIAGNOSIS — H9201 Otalgia, right ear: Secondary | ICD-10-CM | POA: Diagnosis present

## 2016-06-22 HISTORY — DX: Anxiety disorder, unspecified: F41.9

## 2016-06-22 MED ORDER — AZITHROMYCIN 250 MG PO TABS: 250.0000 mg | ORAL_TABLET | Freq: Every day | ORAL | 0 refills | Status: DC

## 2016-06-22 NOTE — ED Triage Notes (Signed)
Pt reports cough, congestion, sore throat, and right ear pain x 2 days.

## 2016-06-22 NOTE — ED Provider Notes (Signed)
[facility]-EMERGENCY DEPT Provider Note   CSN: [id number] Arrival date & time: 06/22/16  2040  By signing my name below, I, Octavia Heir, attest that this documentation has been prepared under the direction and in the presence of Ponciano Ort Champ, PA-C.  Electronically Signed: Octavia Heir, ED Scribe. 06/22/16. 10:05 PM.    History   Chief Complaint Chief Complaint  Patient presents with  . Otalgia   The history is provided by the patient. No language interpreter was used.   HPI Comments: Nichole Curtis is a 21 y.o. female who presents to the Emergency Department complaining of moderate, progressively worsening sore throat x 2 days. She reports associated right ear pain, cough, congestion, and chills. 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 She has not taken any medication to alleviate her symptoms. She denies fever.  Past Medical History:  Diagnosis Date  . Anxiety   . Asthma     Patient Active Problem List   Diagnosis Date Noted  . Pregnancy 09/18/2015    History reviewed. No pertinent surgical history.  OB History    Gravida Para Term Preterm AB Living   SAB TAB Ectopic Multiple Live Births         0 1       Home Medications    Prior to Admission medications   Medication Sig Start Date End Date Taking? Authorizing Provider  albuterol (PROVENTIL,VENTOLIN) 90 MCG/ACT inhaler Inhale 1-4 puffs into the lungs every 6 (six) hours as needed. Shortness of breath and wheezing      Historical Provider, MD  beclomethasone (QVAR) 80 MCG/ACT inhaler Inhale 1 puff into the lungs daily.    Historical Provider, MD  ibuprofen (ADVIL,MOTRIN) 800 MG tablet Take 1 tablet (800 mg total) by mouth every 8 (eight) hours as needed for moderate pain. 09/20/15   Leisel Morris, DO  oxyCODONE-acetaminophen (PERCOCET/ROXICET) 5-325 MG tablet Take 1 tablet by mouth every 4 (four) hours as needed (pain scale 4-7). 09/20/15   Mitchel Honour, DO  Prenatal  Vit-Fe Fumarate-FA (PRENATAL COMPLETE) 14-0.4 MG TABS Take 1 tablet by mouth daily. 01/23/15   Glynn Octave, MD    Family History History reviewed. No pertinent family history.  Social History Social History  Substance Use Topics  . Smoking status: Current Every Day Smoker    Types: E-cigarettes  . Smokeless tobacco: Never Used  . Alcohol use No     Allergies   Penicillins   Review of Systems Review of Systems  Constitutional: Positive for chills. Negative for fever.  HENT: Positive for congestion and sore throat.   Respiratory: Positive for cough. Negative for shortness of breath.   All other systems reviewed and are negative.    Physical Exam Updated Vital Signs BP 105/88 (BP Location: Right Arm)   Pulse 90   Temp 98.9 F (37.2 C) (Oral)   Resp 20   Ht  (1.575 m)   Wt 150 lb (68 kg)   LMP 04/24/2016   SpO2 94%   BMI 27.44 kg/m   Physical Exam  Constitutional: She is oriented to person, place, and time. She appears well-developed and well-nourished.  HENT:  Head: Normocephalic.  Erythema and enlarged bilateral tonsils   Eyes: EOM are normal.  Neck: Normal range of motion.  Pulmonary/Chest: Effort normal and breath sounds normal. No respiratory distress. She has no wheezes. She has no rales. She exhibits no tenderness.  Abdominal:  She exhibits no distension.  Musculoskeletal: Normal range of motion.  Neurological: She is alert and oriented to person, place, and time.  Psychiatric: She has a normal mood and affect.  Nursing note and vitals reviewed.    ED Treatments / Results  DIAGNOSTIC STUDIES: Oxygen Saturation is 94% on RA, low  by my interpretation.  COORDINATION OF CARE:  10:02 PM Discussed treatment plan with pt at bedside and pt agreed to plan.  Labs (all labs ordered are listed, but only abnormal results are displayed) Labs Reviewed - No data to display  EKG  EKG Interpretation None       Radiology No results  found.  Procedures Procedures (including critical care time)  Medications Ordered in ED Medications - No data to display   Initial Impression / Assessment and Plan / ED Course  I have reviewed the triage vital signs and the nursing notes.  Pertinent labs & imaging results that were available during my care of the patient were reviewed by me and considered in my medical decision making (see chart for details).       Final Clinical Impressions(s) / ED Diagnoses   Final diagnoses:  Tonsillitis   No outpatient prescriptions have been marked as taking for the 06/22/16 encounter [facility] Encounter).   An After Visit Summary was printed and given to the patient. New Prescriptions  I personally performed the services in this documentation, which was scribed in my presence.  The recorded information has been reviewed and considered.   Barnet Pall.   Lonia Skinner Crittenden, PA-C 06/22/16 2221    Samuel Jester, DO 06/23/16 0000

## 2016-06-22 NOTE — Discharge Instructions (Signed)
Return if any problems.

## 2016-11-13 ENCOUNTER — Emergency Department (HOSPITAL_COMMUNITY): Payer: BLUE CROSS/BLUE SHIELD

## 2016-11-13 ENCOUNTER — Emergency Department (HOSPITAL_COMMUNITY)
Admission: EM | Admit: 2016-11-13 | Discharge: 2016-11-13 | Disposition: A | Discharge status: Home / Self Care | Payer: BLUE CROSS/BLUE SHIELD | Attending: Emergency Medicine | Admitting: Emergency Medicine

## 2016-11-13 ENCOUNTER — Encounter (HOSPITAL_COMMUNITY): Payer: Self-pay

## 2016-11-13 DIAGNOSIS — J45909 Unspecified asthma, uncomplicated: Secondary | ICD-10-CM | POA: Insufficient documentation

## 2016-11-13 DIAGNOSIS — M545 Low back pain, unspecified: Secondary | ICD-10-CM

## 2016-11-13 DIAGNOSIS — F17210 Nicotine dependence, cigarettes, uncomplicated: Secondary | ICD-10-CM | POA: Diagnosis not present

## 2016-11-13 MED ORDER — METHOCARBAMOL 500 MG PO TABS: 500.0000 mg | ORAL_TABLET | Freq: Two times a day (BID) | ORAL | 0 refills | Status: DC

## 2016-11-13 NOTE — Discharge Instructions (Signed)
Please read attached information. If you experience any new or worsening signs or symptoms please return to the emergency room for evaluation. Please follow-up with your primary care provider or specialist as discussed. Please use medication prescribed only as directed and discontinue taking if you have any concerning signs or symptoms.   °

## 2016-11-13 NOTE — ED Triage Notes (Signed)
Pt reports back pain that began this morning. Pt reports prior injury to back 3-4 years ago. 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 Denies any urinary symptoms.

## 2016-11-13 NOTE — ED Provider Notes (Signed)
[facility]-EMERGENCY DEPT Provider Note   CSN: [id number] Arrival date & time: 11/13/16  1417     History   Chief Complaint Chief Complaint  Patient presents with  . Back Pain    HPI Nichole Curtis is a 21 y.o. female.  HPI   22 year old female presents today with complaints of back pain. Patient reports a remote history of back pain. She denies any chronic back pain, reports that she was at her baseline until waking up this morning with low right lateral lumbar back pain. 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 She denies any significant injury. She notes pain radiates into her buttocks, no radiation down the lower extremity, no loss of distal sensation strength or motor function, no red flags for back pain. Patient denies any abdominal pain or any other significant etiology. Symptoms are made worse with movements or palpation of the lower lumbar    Past Medical History:  Diagnosis Date  . Anxiety   . Asthma     Patient Active Problem List   Diagnosis Date Noted  . Pregnancy 09/18/2015    History reviewed. No pertinent surgical history.  OB History    Gravida Para Term Preterm AB Living   1 1 1     1    SAB TAB Ectopic Multiple Live Births         0 1       Home Medications    Prior to Admission medications   Medication Sig Start Date End Date Taking? Authorizing Provider  albuterol (PROVENTIL,VENTOLIN) 90 MCG/ACT inhaler Inhale 1-4 puffs into the lungs every 6 (six) hours as needed. Shortness of breath and wheezing      [provider]  azithromycin (ZITHROMAX) 250 MG tablet Take 1 tablet (250 mg total) by mouth daily. Take first 2 tablets together, then 1 every day until finished. 06/22/16   Elson Areas, PA-C  beclomethasone (QVAR) 80 MCG/ACT inhaler Inhale 1 puff into the lungs daily.    [provider]  ibuprofen (ADVIL,MOTRIN) 800 MG tablet Take 1 tablet (800 mg total) by mouth every 8 (eight) hours as needed for  moderate pain. 09/20/15   Morris, Aundra Millet, DO  methocarbamol (ROBAXIN) 500 MG tablet Take 1 tablet (500 mg total) by mouth 2 (two) times daily. 11/13/16   Larna Capelle, Tinnie Gens, PA-C  oxyCODONE-acetaminophen (PERCOCET/ROXICET) 5-325 MG tablet Take 1 tablet by mouth every 4 (four) hours as needed (pain scale 4-7). 09/20/15   Mitchel Honour, DO  Prenatal Vit-Fe Fumarate-FA (PRENATAL COMPLETE) 14-0.4 MG TABS Take 1 tablet by mouth daily. 01/23/15   Glynn Octave, MD    Family History No family history on file.  Social History Social History  Substance Use Topics  . Smoking status: Current Every Day Smoker    Types: E-cigarettes  . Smokeless tobacco: Never Used  . Alcohol use No     Allergies   Penicillins   Review of Systems Review of Systems  All other systems reviewed and are negative.    Physical Exam Updated Vital Signs BP 119/64 (BP Location: Left Arm)   Pulse 94   Temp 98.4 F (36.9 C) (Oral)   Resp 17   Ht 5\' 2"  (1.575 m)   Wt 70.8 kg (156 lb)   LMP 10/18/2016   SpO2 96%   BMI 28.53 kg/m   Physical Exam  Constitutional: She is oriented to person, place, and time. She appears well-developed and well-nourished.  HENT:  Head: Normocephalic and atraumatic.  Eyes: Pupils are equal, round, and reactive to light. Conjunctivae are normal. Right eye exhibits no discharge. Left eye exhibits no discharge. No scleral icterus.  Neck: Normal range of motion. No JVD present. No tracheal deviation present.  Pulmonary/Chest: Effort normal. No stridor.  Musculoskeletal:  No CT or L-spine tenderness to palpation. Tenderness to palpation of right lateral lumbar soft tissue full active range of motion of lower extremities, distal sensation strength intact.  Neurological: She is alert and oriented to person, place, and time. Coordination normal.  Psychiatric: She has a normal mood and affect. Her behavior is normal. Judgment and thought content normal.  Nursing note and vitals  reviewed.    ED Treatments / Results  Labs (all labs ordered are listed, but only abnormal results are displayed) Labs Reviewed - No data to display  EKG  EKG Interpretation None       Radiology Dg Lumbar Spine Complete  Result Date: 11/13/2016 CLINICAL DATA:  Back pain began this morning. EXAM: LUMBAR SPINE - COMPLETE 4+ VIEW COMPARISON:  None. FINDINGS: There are 5 nonrib bearing lumbar-type vertebral bodies. The vertebral body heights are maintained. The alignment is anatomic. There is no static listhesis. There is no spondylolysis. There is no acute fracture. There is minimal degenerative disc disease with disc height loss at L5-S1. The SI joints are unremarkable. IMPRESSION: No acute osseous injury of the lumbar spine. Electronically Signed   By: Elige Ko   On: 11/13/2016 15:02    Procedures Procedures (including critical care time)  Medications Ordered in ED Medications - No data to display   Initial Impression / Assessment and Plan / ED Course  I have reviewed the triage vital signs and the nursing notes.  Pertinent labs & imaging results that were available during my care of the patient were reviewed by me and considered in my medical decision making (see chart for details).      Final Clinical Impressions(s) / ED Diagnoses   Final diagnoses:  Acute right-sided low back pain without sciatica    21 year old female with and Get it back pain. Likely musculoskeletal. No red flags. Symptomatic care instructions and follow-up information given. Patient had no further questions or concerns at time of discharge.  New Prescriptions New Prescriptions   METHOCARBAMOL (ROBAXIN) 500 MG TABLET    Take 1 tablet (500 mg total) by mouth 2 (two) times daily.     Eyvonne Mechanic, PA-C 11/13/16 1718    Geoffery Lyons, MD 11/14/16 [phone]

## 2017-06-23 DIAGNOSIS — F17210 Nicotine dependence, cigarettes, uncomplicated: Secondary | ICD-10-CM | POA: Insufficient documentation

## 2017-06-23 DIAGNOSIS — L2082 Flexural eczema: Secondary | ICD-10-CM | POA: Insufficient documentation

## 2017-12-04 ENCOUNTER — Emergency Department (HOSPITAL_COMMUNITY): Payer: BLUE CROSS/BLUE SHIELD

## 2017-12-04 ENCOUNTER — Emergency Department (HOSPITAL_COMMUNITY)
Admission: EM | Admit: 2017-12-04 | Discharge: 2017-12-04 | Disposition: A | Discharge status: Home / Self Care | Payer: BLUE CROSS/BLUE SHIELD | Attending: Emergency Medicine | Admitting: Emergency Medicine

## 2017-12-04 ENCOUNTER — Encounter (HOSPITAL_COMMUNITY): Payer: Self-pay

## 2017-12-04 ENCOUNTER — Other Ambulatory Visit: Payer: Self-pay

## 2017-12-04 DIAGNOSIS — F17290 Nicotine dependence, other tobacco product, uncomplicated: Secondary | ICD-10-CM | POA: Insufficient documentation

## 2017-12-04 DIAGNOSIS — Z79899 Other long term (current) drug therapy: Secondary | ICD-10-CM | POA: Insufficient documentation

## 2017-12-04 DIAGNOSIS — R0789 Other chest pain: Secondary | ICD-10-CM | POA: Insufficient documentation

## 2017-12-04 DIAGNOSIS — J45909 Unspecified asthma, uncomplicated: Secondary | ICD-10-CM | POA: Insufficient documentation

## 2017-12-04 LAB — I-STAT TROPONIN, ED
TROPONIN I, POC: 0.02 ng/mL (ref 0.00–0.08)
Troponin i, poc: 0.02 ng/mL (ref 0.00–0.08)

## 2017-12-04 LAB — CBC
HCT: 44.9 % (ref 36.0–46.0)
HEMOGLOBIN: 14.7 g/dL (ref 12.0–15.0)
MCH: 27.9 pg (ref 26.0–34.0)
MCHC: 32.7 g/dL (ref 30.0–36.0)
MCV: 85.2 fL (ref 78.0–100.0)
Platelets: 354 10*3/uL (ref 150–400)
RBC: 5.27 MIL/uL — AB (ref 3.87–5.11)
RDW: 12.1 % (ref 11.5–15.5)
WBC: 10.3 10*3/uL (ref 4.0–10.5)

## 2017-12-04 LAB — BASIC METABOLIC PANEL
Anion gap: 10 (ref 5–15)
BUN: 16 mg/dL (ref 6–20)
CALCIUM: 9.6 mg/dL (ref 8.9–10.3)
CO2: 22 mmol/L (ref 22–32)
Chloride: 105 mmol/L (ref 98–111)
Creatinine, Ser: 0.79 mg/dL (ref 0.44–1.00)
GFR calc Af Amer: >60 mL/min (ref >60)
Glucose, Bld: 98 mg/dL (ref 70–99)
Potassium: 4 mmol/L (ref 3.5–5.1)
Sodium: 137 mmol/L (ref 135–145)

## 2017-12-04 LAB — I-STAT BETA HCG BLOOD, ED (MC, WL, AP ONLY): I-stat hCG, quantitative: <5 m[IU]/mL (ref <5)

## 2017-12-04 LAB — MAGNESIUM: Magnesium: 2.3 mg/dL (ref 1.7–2.4)

## 2017-12-04 NOTE — ED Provider Notes (Signed)
[facility] EMERGENCY DEPARTMENT Provider Note   CSN: [id number] Arrival date & time: 12/04/17  1543     History   Chief Complaint Chief Complaint  Patient presents with  . Chest Pain    HPI Nichole Curtis is a 22 y.o. female with history of anxiety and asthma presents for evaluation of acute onset, resolved episode of left-sided chest pain at around 1 PM.  She states that she was driving when she began to feel a pinching sensation to the left side of the chest which lasted approximately 1 minute before resolving.  She then developed pins and needle sensation to the left upper extremity which she states is still present but has improved significantly.  She denies any associated shortness of breath, nausea, vomiting, diaphoresis, or lightheadedness.  No aggravating or alleviating factors noted.  She states that she has had similar episodes of chest pain in the past which began when she was a teenager for which she has undergone cardiac work-up and diagnosed with anxiety.  Her symptoms are usually well managed with lorazepam but she has not been able to find this medicine and did not take any today.  She is a non-smoker but does vape.  Denies recreational drug use, endorses very occasional alcohol use.  Father passed away from an MI 2 years ago and mother had an MI shortly thereafter which mother states was "stress-induced".  The history is provided by the patient.    Past Medical History:  Diagnosis Date  . Anxiety   . Asthma     Patient Active Problem List   Diagnosis Date Noted  . Pregnancy 09/18/2015    History reviewed. No pertinent surgical history.   OB History    Gravida  1   Para  1   Term  1   Preterm      AB      Living  1     SAB      TAB      Ectopic      Multiple  0   Live Births  1            Home Medications    Prior to Admission medications   Medication Sig Start Date End Date Taking? Authorizing Provider    albuterol (PROVENTIL,VENTOLIN) 90 MCG/ACT inhaler Inhale 1-4 puffs into the lungs every 6 (six) hours as needed. Shortness of breath and wheezing      [provider]  azithromycin (ZITHROMAX) 250 MG tablet Take 1 tablet (250 mg total) by mouth daily. Take first 2 tablets together, then 1 every day until finished. 06/22/16   Elson Areas, PA-C  beclomethasone (QVAR) 80 MCG/ACT inhaler Inhale 1 puff into the lungs daily.    [provider]  ibuprofen (ADVIL,MOTRIN) 800 MG tablet Take 1 tablet (800 mg total) by mouth every 8 (eight) hours as needed for moderate pain. 09/20/15   Morris, Aundra Millet, DO  methocarbamol (ROBAXIN) 500 MG tablet Take 1 tablet (500 mg total) by mouth 2 (two) times daily. 11/13/16   Hedges, Tinnie Gens, PA-C  oxyCODONE-acetaminophen (PERCOCET/ROXICET) 5-325 MG tablet Take 1 tablet by mouth every 4 (four) hours as needed (pain scale 4-7). 09/20/15   Mitchel Honour, DO  Prenatal Vit-Fe Fumarate-FA (PRENATAL COMPLETE) 14-0.4 MG TABS Take 1 tablet by mouth daily. 01/23/15   Glynn Octave, MD    Family History No family history on file.  Social History Social History   Tobacco Use  . Smoking status: Current  Every Day Smoker    Types: E-cigarettes  . Smokeless tobacco: Never Used  Substance Use Topics  . Alcohol use: Yes    Comment: social   . Drug use: No     Allergies   Penicillins   Review of Systems Review of Systems  Constitutional: Negative for chills, diaphoresis and fever.  Respiratory: Negative for shortness of breath.   Cardiovascular: Positive for chest pain.  Gastrointestinal: Negative for abdominal pain, nausea and vomiting.  Neurological: Negative for weakness and light-headedness.  All other systems reviewed and are negative.    Physical Exam Updated Vital Signs BP 118/81 (BP Location: Right Arm)   Pulse 75   Temp 98.2 F (36.8 C) (Oral)   Resp 16   Ht 5\' 2"  (1.575 m)   Wt 72.6 kg   SpO2 97%   BMI 29.26 kg/m   Physical  Exam  Constitutional: She is oriented to person, place, and time. She appears well-developed and well-nourished. No distress.  HENT:  Head: Normocephalic and atraumatic.  Eyes: Conjunctivae are normal. Right eye exhibits no discharge. Left eye exhibits no discharge.  Neck: Normal range of motion. Neck supple. No JVD present. No tracheal deviation present.  Cardiovascular: Normal rate and regular rhythm.  Pulses:      Carotid pulses are 2+ on the right side, and 2+ on the left side.      Radial pulses are 2+ on the right side, and 2+ on the left side.       Dorsalis pedis pulses are 2+ on the right side, and 2+ on the left side.       Posterior tibial pulses are 2+ on the right side, and 2+ on the left side.  2+ radial and DP/PT pulses bilaterally, Homans sign absent bilaterally, no lower extremity edema, no palpable cords, compartments are soft   Pulmonary/Chest: Effort normal and breath sounds normal.  Equal rise and fall of chest, no increased work of breathing.  Speaking in full sentences without difficulty.  No tenderness to palpation of the chest wall.  Abdominal: Soft. Bowel sounds are normal. She exhibits no distension. There is no tenderness.  Musculoskeletal: She exhibits no edema.       Right lower leg: Normal. She exhibits no tenderness and no edema.       Left lower leg: Normal. She exhibits no tenderness and no edema.  Neurological: She is alert and oriented to person, place, and time. No cranial nerve deficit.  Fluent speech, no facial droop, sensation intact to soft touch of bilateral upper extremities.  Grip strength bilaterally.  5/5 strength of BUE major muscle groups.  Skin: Skin is warm and dry. No erythema.  Psychiatric: She has a normal mood and affect. Her behavior is normal.  Nursing note and vitals reviewed.    ED Treatments / Results  Labs (all labs ordered are listed, but only abnormal results are displayed) Labs Reviewed  CBC - Abnormal; Notable for the  following components:      Result Value   RBC 5.27 (*)    All other components within normal limits  BASIC METABOLIC PANEL  MAGNESIUM  I-STAT TROPONIN, ED  I-STAT BETA HCG BLOOD, ED ([facility], [facility], [facility] ONLY)  I-STAT TROPONIN, ED    EKG EKG Interpretation  Date/Time:  Thursday December 04 2017 15:49:06 EDT Ventricular Rate:  86 PR Interval:  144 QRS Duration: 74 QT Interval:  350 QTC Calculation: 418 R Axis:   71 Text Interpretation:  Normal sinus rhythm  Normal ECG Confirmed by Benjiman CorePickering, Nathan [id number]) on 12/04/2017 7:32:57 PM   Radiology Dg Chest 2 View  Result Date: 12/04/2017 CLINICAL DATA:  Patient with chest pain and left arm tingling EXAM: CHEST - 2 VIEW COMPARISON:  Thoracic spine radiograph 09/12/2006 FINDINGS: Normal cardiac and mediastinal contours. No consolidative pulmonary opacities. No pleural effusion or pneumothorax. IMPRESSION: No acute cardiopulmonary process. Electronically Signed   By: Annia Beltrew  Davis M.D.   On: 12/04/2017 17:56    Procedures Procedures (including critical care time)  Medications Ordered in ED Medications - No data to display   Initial Impression / Assessment and Plan / ED Course  I have reviewed the triage vital signs and the nursing notes.  Pertinent labs & imaging results that were available during my care of the patient were reviewed by me and considered in my medical decision making (see chart for details).     Patient presents for evaluation of acute onset, resolved episode of left-sided chest pain.  She has had episodes like this in the past but they are not usually accompanied by paresthesias of the left upper extremity.  She is afebrile, vital signs are stable.  She is nontoxic in appearance.  Chest pain is not reproducible on palpation, it is not exertional or pleuritic.  Chest pain has resolved on my assessment but she does have mild paresthesias of the left upper extremity which are subjective.  Sensation is intact to soft touch of  extremities and otherwise normal neuro exam.  EKG shows normal sinus rhythm with no ischemic changes.  Remainder of lab work reviewed by me shows no leukocytosis, no anemia, no metabolic derangements.  She is not pregnant.  Serial troponins are negative.  She is low risk for cardiac etiology of symptoms.  Doubt ACS/MI.  Chest x-ray shows no acute cardiopulmonary disease, no evidence of pneumonia or pleural effusion.  I doubt dissection, pericarditis, myocarditis, PE.  She is resting comfortably no apparent distress.  She does have a PCP who manages her anxiety and I recommended follow-up with her for refills of Ativan.  We will also give referral for cardiology on an outpatient basis.  She has seen them in the past when her symptoms initially began out in high school.  Discussed strict ED return precautions.  Patient and patient's mother verbalized understanding of and agreement with plan and patient is stable for discharge home at this time.  Final Clinical Impressions(s) / ED Diagnoses   Final diagnoses:  Atypical chest pain    ED Discharge Orders    None       Bennye AlmFawze, Venera Privott A, PA-C 12/04/17 2200    Benjiman CorePickering, Nathan, MD 12/05/17 [phone]

## 2017-12-04 NOTE — ED Provider Notes (Signed)
MSE was initiated and I personally evaluated the patient  4:10 PM on December 04, 2017.  Patient placed in Quick Look pathway, seen and evaluated   Chief Complaint: Chest pain  HPI:   22 yo female with hx of tobacco abuse, anxiety, and asthma who presents with intermittent chest pain since 1300 this afternoon. Patient was driving when she developed L sided chest pain radiated down the LUE with paresthesias to this area and associated anxiety. Has had this intermittently since onset, not at present. Hx of similar chest pain related to anxiety, but never had LUE sxs. Ran out of her anxiety medicines. Denies dyspnea.   ROS: + CP and paresthesias and anxiety. - dyspnea  Physical Exam:   Gen: No distress  Neuro: Awake and Alert  Skin: Warm    Focused Exam: Heart RRR. Lungs CTA  Likely anxiety given hx of fairly similar. Labs per triage nursing staff prior to my eval.  Initiation of care has begun. The patient has been counseled on the process, plan, and necessity for staying for the completion/evaluation, and the remainder of the medical screening examination. I discussed with patient  and if present family/friends the importance of alerting staff to any new or worsening symptoms or any other concerns throughout his/her ER visit.     Desmond Lopeetrucelli, Adiah Guereca R, PA-C 12/04/17 1614    Benjiman CorePickering, Nathan, MD 12/05/17 [phone]

## 2017-12-04 NOTE — ED Triage Notes (Signed)
Pt states that she has been having CP with tingling in her left arm starting around 1. Pt reports family hx of cardiac problems. Pt was driving when episode started.

## 2017-12-04 NOTE — Discharge Instructions (Signed)
Continue to take your home medicines as prescribed.  Follow-up with your primary care physician for reevaluation of your symptoms and for refills of your home medicines.  I have also given you the information for our cardiologists and you may find it helpful to follow-up with them on an outpatient basis for further evaluation.  Your work-up today was reassuring that you are not having a heart attack.  Return to the emergency department if any concerning signs or symptoms develop such as worsening pain, fevers, coughing up blood, lightheadedness, or passing out.

## 2017-12-04 NOTE — ED Notes (Signed)
ED Provider at bedside. 

## 2017-12-08 DIAGNOSIS — F410 Panic disorder [episodic paroxysmal anxiety] without agoraphobia: Secondary | ICD-10-CM | POA: Insufficient documentation

## 2017-12-08 DIAGNOSIS — E669 Obesity, unspecified: Secondary | ICD-10-CM | POA: Insufficient documentation

## 2017-12-08 DIAGNOSIS — F411 Generalized anxiety disorder: Secondary | ICD-10-CM | POA: Insufficient documentation

## 2017-12-18 ENCOUNTER — Other Ambulatory Visit: Payer: Self-pay

## 2017-12-18 ENCOUNTER — Emergency Department (HOSPITAL_COMMUNITY): Payer: BLUE CROSS/BLUE SHIELD

## 2017-12-18 ENCOUNTER — Encounter (HOSPITAL_COMMUNITY): Payer: Self-pay

## 2017-12-18 ENCOUNTER — Emergency Department (HOSPITAL_COMMUNITY)
Admission: EM | Admit: 2017-12-18 | Discharge: 2017-12-18 | Disposition: A | Discharge status: Home / Self Care | Payer: BLUE CROSS/BLUE SHIELD | Attending: Emergency Medicine | Admitting: Emergency Medicine

## 2017-12-18 DIAGNOSIS — Z79899 Other long term (current) drug therapy: Secondary | ICD-10-CM | POA: Insufficient documentation

## 2017-12-18 DIAGNOSIS — Y929 Unspecified place or not applicable: Secondary | ICD-10-CM | POA: Diagnosis not present

## 2017-12-18 DIAGNOSIS — Y999 Unspecified external cause status: Secondary | ICD-10-CM | POA: Diagnosis not present

## 2017-12-18 DIAGNOSIS — S5001XA Contusion of right elbow, initial encounter: Secondary | ICD-10-CM

## 2017-12-18 DIAGNOSIS — S59901A Unspecified injury of right elbow, initial encounter: Secondary | ICD-10-CM | POA: Diagnosis present

## 2017-12-18 DIAGNOSIS — W010XXA Fall on same level from slipping, tripping and stumbling without subsequent striking against object, initial encounter: Secondary | ICD-10-CM | POA: Insufficient documentation

## 2017-12-18 DIAGNOSIS — F17290 Nicotine dependence, other tobacco product, uncomplicated: Secondary | ICD-10-CM | POA: Diagnosis not present

## 2017-12-18 DIAGNOSIS — Y939 Activity, unspecified: Secondary | ICD-10-CM | POA: Diagnosis not present

## 2017-12-18 DIAGNOSIS — J45909 Unspecified asthma, uncomplicated: Secondary | ICD-10-CM | POA: Insufficient documentation

## 2017-12-18 NOTE — ED Provider Notes (Addendum)
[facility]-EMERGENCY DEPT Provider Note   CSN: [id number] Arrival date & time: 12/18/17  1025     History   Chief Complaint Chief Complaint  Patient presents with  . Arm Pain    HPI Nichole Curtis is a 22 y.o. female.  HPI  22 year old female comes in with chief complaint of elbow pain.  Patient reports that she slipped over foreign body and fell onto her right elbow.  Fall occurred about 3 hours ago, and patient has been having persistent pain in her elbow.  Patient has no associated numbness, tingling.  She also denies any pain over her shoulders or hand.  No head trauma.  Past Medical History:  Diagnosis Date  . Anxiety   . Asthma     Patient Active Problem List   Diagnosis Date Noted  . Pregnancy 09/18/2015    History reviewed. No pertinent surgical history.   OB History    Gravida  1   Para  1   Term  1   Preterm      AB      Living  1     SAB      TAB      Ectopic      Multiple  0   Live Births  1            Home Medications    Prior to Admission medications   Medication Sig Start Date End Date Taking? Authorizing Provider  albuterol (PROVENTIL,VENTOLIN) 90 MCG/ACT inhaler Inhale 1-4 puffs into the lungs every 6 (six) hours as needed. Shortness of breath and wheezing      [provider]  azithromycin (ZITHROMAX) 250 MG tablet Take 1 tablet (250 mg total) by mouth daily. Take first 2 tablets together, then 1 every day until finished. 06/22/16   Elson Areas, PA-C  beclomethasone (QVAR) 80 MCG/ACT inhaler Inhale 1 puff into the lungs daily.    [provider]  ibuprofen (ADVIL,MOTRIN) 800 MG tablet Take 1 tablet (800 mg total) by mouth every 8 (eight) hours as needed for moderate pain. 09/20/15   Morris, Aundra Millet, DO  methocarbamol (ROBAXIN) 500 MG tablet Take 1 tablet (500 mg total) by mouth 2 (two) times daily. 11/13/16   Hedges, Tinnie Gens, PA-C  oxyCODONE-acetaminophen (PERCOCET/ROXICET) 5-325 MG  tablet Take 1 tablet by mouth every 4 (four) hours as needed (pain scale 4-7). 09/20/15   Mitchel Honour, DO  Prenatal Vit-Fe Fumarate-FA (PRENATAL COMPLETE) 14-0.4 MG TABS Take 1 tablet by mouth daily. 01/23/15   Glynn Octave, MD    Family History History reviewed. No pertinent family history.  Social History Social History   Tobacco Use  . Smoking status: Current Every Day Smoker    Types: E-cigarettes  . Smokeless tobacco: Never Used  Substance Use Topics  . Alcohol use: Yes    Comment: social   . Drug use: No     Allergies   Penicillins   Review of Systems Review of Systems  Constitutional: Positive for activity change.  Musculoskeletal: Positive for arthralgias.  Skin: Positive for rash and wound.  Neurological: Negative for numbness.     Physical Exam Updated Vital Signs BP 119/81 (BP Location: Left Arm)   Pulse 72   Temp 98.4 F (36.9 C) (Oral)   Resp 16   Ht 5\' 2"  (1.575 m)   Wt 75.8 kg   SpO2 95%   BMI 30.54 kg/m   Physical Exam  Constitutional: She appears well-developed.  HENT:  Head: Atraumatic.  Neck: Neck supple.  Cardiovascular: Normal rate.  Pulmonary/Chest: Effort normal.  Abdominal: Bowel sounds are normal.  Musculoskeletal: She exhibits tenderness. She exhibits no edema.  Patient able to pronate and supinate her right upper extremity with little flexed  Neurological: She is alert.  Skin: Skin is warm.  Nursing note and vitals reviewed.    ED Treatments / Results  Labs (all labs ordered are listed, but only abnormal results are displayed) Labs Reviewed - No data to display  EKG None  Radiology Dg Elbow Complete Right  Result Date: 12/18/2017 CLINICAL DATA:  Patient slipped and landed on right elbow. EXAM: RIGHT ELBOW - COMPLETE 3+ VIEW COMPARISON:  No recent. FINDINGS: Soft tissue structures are unremarkable. No acute bony or joint abnormality. No evidence of fracture. IMPRESSION: No acute bony or joint abnormality.  Electronically Signed   By: Maisie Fus  Register   On: 12/18/2017 10:56    Procedures Procedures (including critical care time)  Medications Ordered in ED Medications - No data to display   Initial Impression / Assessment and Plan / ED Course  I have reviewed the triage vital signs and the nursing notes.  Pertinent labs & imaging results that were available during my care of the patient were reviewed by me and considered in my medical decision making (see chart for details).     22 year old female comes in with chief complaint of elbow pain.  She had a mechanical fall, and it seems like she has elbow contusion.  She is able to pronate, supinate with her elbow flexed.  Neurovascularly she is intact.  PRN follow-up with orthopedics if she is not getting better. RICE advised.  Final Clinical Impressions(s) / ED Diagnoses   Final diagnoses:  Contusion of right elbow, initial encounter    ED Discharge Orders    None       Derwood Kaplan, MD 12/18/17 1150

## 2017-12-18 NOTE — ED Triage Notes (Signed)
Patient slipped on dog poop and landed on her right elbow today.

## 2017-12-18 NOTE — Discharge Instructions (Signed)
We saw you in the ER for pain in your right elbow. The x-rays are not showing any fractures.  We suspect that you have a high-grade contusion, which should improve with ice and anti-inflammatory medications.  Take ibuprofen 400 mg every 6 hours for the next 2 to 3 days, and additional Tylenol if needed for pain.  See the orthopedic doctor only if your symptoms are not getting better.

## 2018-03-21 ENCOUNTER — Ambulatory Visit (HOSPITAL_COMMUNITY)
Admission: EM | Admit: 2018-03-21 | Discharge: 2018-03-21 | Disposition: A | Discharge status: Home / Self Care | Payer: BLUE CROSS/BLUE SHIELD | Attending: Family Medicine | Admitting: Family Medicine

## 2018-03-21 ENCOUNTER — Encounter (HOSPITAL_COMMUNITY): Payer: Self-pay

## 2018-03-21 DIAGNOSIS — J4541 Moderate persistent asthma with (acute) exacerbation: Secondary | ICD-10-CM

## 2018-03-21 MED ORDER — PREDNISONE 50 MG PO TABS: 50.0000 mg | ORAL_TABLET | Freq: Every day | ORAL | 0 refills | Status: DC

## 2018-03-21 MED ORDER — BENZONATATE 100 MG PO CAPS: 100.0000 mg | ORAL_CAPSULE | Freq: Three times a day (TID) | ORAL | 0 refills | Status: DC

## 2018-03-21 MED ORDER — IPRATROPIUM-ALBUTEROL 0.5-2.5 (3) MG/3ML IN SOLN
RESPIRATORY_TRACT | Status: AC
  Filled 2018-03-21: qty 3

## 2018-03-21 MED ORDER — IPRATROPIUM-ALBUTEROL 0.5-2.5 (3) MG/3ML IN SOLN
3.0000 mL | Freq: Once | RESPIRATORY_TRACT | Status: AC
  Administered 2018-03-21: 3 mL via RESPIRATORY_TRACT

## 2018-03-21 MED ORDER — IPRATROPIUM BROMIDE 0.06 % NA SOLN: 2.0000 | Freq: Four times a day (QID) | NASAL | 0 refills | Status: DC

## 2018-03-21 NOTE — Discharge Instructions (Addendum)
Tessalon for cough. Start higher dose of prednisone, discontinue lower dose for now. Start atrovent nasal spray for nasal congestion/drainage. You can use over the counter nasal saline rinse such as neti pot for nasal congestion. Keep hydrated, your urine should be clear to pale yellow in color. Tylenol/motrin for fever and pain. Monitor for any worsening of symptoms, chest pain, shortness of breath, wheezing, swelling of the throat, follow up for reevaluation.  For sore throat/cough try using a honey-based tea. Use 3 teaspoons of honey with juice squeezed from half lemon. Place shaved pieces of ginger into 1/2-1 cup of water and warm over stove top. Then mix the ingredients and repeat every 4 hours as needed.

## 2018-03-21 NOTE — ED Provider Notes (Signed)
[facility]    CSN: [id number] Arrival date & time: 03/21/18  1141     History   Chief Complaint Chief Complaint  Patient presents with  . Shortness of Breath  . Asthma    HPI Nichole Curtis is a 22 y.o. female.   [age] year old female with history of asthma comes in for few day history of URI symptoms.  Has had rhinorrhea, nasal congestion, cough.  Denies fever, chills, night sweats.  For the past 2 to 3 days, has had increasing wheezing and shortness of breath that is not completely resolved by albuterol.  States PCP provided 10 mg prednisone, and was told that she can take if has increased wheezing despite albuterol use.  She has been taking 10 mg prednisone for the past 2 to 3 days.  OTC cold medication without relief.  Positive sick contact.     Past Medical History:  Diagnosis Date  . Anxiety   . Asthma     Patient Active Problem List   Diagnosis Date Noted  . Pregnancy 09/18/2015    History reviewed. No pertinent surgical history.  OB History    Gravida  1   Para  1   Term  1   Preterm      AB      Living  1     SAB      TAB      Ectopic      Multiple  0   Live Births  1            Home Medications    Prior to Admission medications   Medication Sig Start Date End Date Taking? Authorizing Provider  albuterol (PROVENTIL,VENTOLIN) 90 MCG/ACT inhaler Inhale 1-4 puffs into the lungs every 6 (six) hours as needed. Shortness of breath and wheezing      [provider]  beclomethasone (QVAR) 80 MCG/ACT inhaler Inhale 1 puff into the lungs daily.    [provider]  benzonatate (TESSALON) 100 MG capsule Take 1 capsule (100 mg total) by mouth every 8 (eight) hours. 03/21/18   Cathie HoopsYu, Jaqualyn Juday V, PA-C  ibuprofen (ADVIL,MOTRIN) 800 MG tablet Take 1 tablet (800 mg total) by mouth every 8 (eight) hours as needed for moderate pain. 09/20/15   Morris, Azula, DO  ipratropium (ATROVENT) 0.06 % nasal spray Place 2 sprays into both  nostrils 4 (four) times daily. 03/21/18   Cathie HoopsYu, Rabecka Brendel V, PA-C  methocarbamol (ROBAXIN) 500 MG tablet Take 1 tablet (500 mg total) by mouth 2 (two) times daily. 11/13/16   Hedges, Tinnie GensJeffrey, PA-C  oxyCODONE-acetaminophen (PERCOCET/ROXICET) 5-325 MG tablet Take 1 tablet by mouth every 4 (four) hours as needed (pain scale 4-7). 09/20/15   Morris, Aundra MilletMegan, DO  predniSONE (DELTASONE) 50 MG tablet Take 1 tablet (50 mg total) by mouth daily. 03/21/18   Belinda FisherYu, Hope Brandenburger V, PA-C  Prenatal Vit-Fe Fumarate-FA (PRENATAL COMPLETE) 14-0.4 MG TABS Take 1 tablet by mouth daily. 01/23/15   Glynn Octaveancour, Stephen, MD    Family History History reviewed. No pertinent family history.  Social History Social History   Tobacco Use  . Smoking status: Current Every Day Smoker    Types: E-cigarettes  . Smokeless tobacco: Never Used  Substance Use Topics  . Alcohol use: Yes    Comment: social   . Drug use: No     Allergies   Peanut-containing drug products and Penicillins   Review of Systems Review of Systems  Reason unable to perform ROS: See HPI as  above.     Physical Exam Triage Vital Signs ED Triage Vitals [03/21/18 1327]  Enc Vitals Group     BP 108/65     Pulse Rate 82     Resp 18     Temp 98 F (36.7 C)     Temp Source Oral     SpO2 92 %     Weight      Height      Head Circumference      Peak Flow      Pain Score 0     Pain Loc      Pain Edu?      Excl. in GC?    No data found.  Updated Vital Signs BP 108/65 (BP Location: Left Arm)   Pulse 82   Temp 98 F (36.7 C) (Oral)   Resp 18   LMP 03/09/2018   SpO2 92%   Physical Exam Constitutional:      General: She is not in acute distress.    Appearance: She is well-developed. She is not ill-appearing, toxic-appearing or diaphoretic.  HENT:     Head: Normocephalic and atraumatic.     Right Ear: Tympanic membrane, ear canal and external ear normal. Tympanic membrane is not erythematous or bulging.     Left Ear: Tympanic membrane, ear canal and  external ear normal. Tympanic membrane is not erythematous or bulging.     Nose: Nose normal.     Right Sinus: No maxillary sinus tenderness or frontal sinus tenderness.     Left Sinus: No maxillary sinus tenderness or frontal sinus tenderness.     Mouth/Throat:     Pharynx: Uvula midline.  Eyes:     Conjunctiva/sclera: Conjunctivae normal.     Pupils: Pupils are equal, round, and reactive to light.  Neck:     Musculoskeletal: Normal range of motion and neck supple.  Cardiovascular:     Rate and Rhythm: Normal rate and regular rhythm.     Heart sounds: Normal heart sounds. No murmur. No friction rub. No gallop.   Pulmonary:     Effort: Pulmonary effort is normal. No tachypnea, accessory muscle usage or respiratory distress.     Breath sounds: Normal breath sounds. No stridor. No decreased breath sounds, wheezing, rhonchi or rales.     Comments: Speaking in full sentences.  Mild wheezing on end of expiration diffusely.  Decreased air movement. Lymphadenopathy:     Cervical: No cervical adenopathy.  Skin:    General: Skin is warm and dry.  Neurological:     Mental Status: She is alert and oriented to person, place, and time.  Psychiatric:        Behavior: Behavior normal.        Judgment: Judgment normal.      UC Treatments / Results  Labs (all labs ordered are listed, but only abnormal results are displayed) Labs Reviewed - No data to display  EKG None  Radiology No results found.  Procedures Procedures (including critical care time)  Medications Ordered in UC Medications  ipratropium-albuterol (DUONEB) 0.5-2.5 (3) MG/3ML nebulizer solution 3 mL (3 mLs Nebulization Given 03/21/18 1356)    Initial Impression / Assessment and Plan / UC Course  I have reviewed the triage vital signs and the nursing notes.  Pertinent labs & imaging results that were available during my care of the patient were reviewed by me and considered in my medical decision making (see chart for  details).    Patient with improved symptoms  after DuoNeb x1.  Better air movement, inspiratory and expiratory wheezing throughout.  O2 saturation 98%.  Discussed DuoNeb x2 given inspiratory and expiratory wheezing, patient declined at this time.  She is stable, O2 saturation 98%, speaking in full sentences without respiratory distress.  Will have patient continue albuterol inhaler and nebulizer at home.  Will increase prednisone to 50 mg daily for the next 5 days.  Other symptomatic treatment discussed.  Push fluids.  Return precautions given.  Patient expresses understanding and agrees to plan.  Final Clinical Impressions(s) / UC Diagnoses   Final diagnoses:  Moderate persistent asthma with exacerbation    ED Prescriptions    Medication Sig Dispense Auth. Provider   predniSONE (DELTASONE) 50 MG tablet Take 1 tablet (50 mg total) by mouth daily. 5 tablet Arlin Savona V, PA-C   ipratropium (ATROVENT) 0.06 % nasal spray Place 2 sprays into both nostrils 4 (four) times daily. 15 mL Aalani Aikens V, PA-C   benzonatate (TESSALON) 100 MG capsule Take 1 capsule (100 mg total) by mouth every 8 (eight) hours. 21 capsule Threasa AlphaYu, Kourtney Montesinos V, PA-C       Abdias Hickam V, [place] JerseyPA-C 03/21/18 1417

## 2018-03-21 NOTE — ED Triage Notes (Signed)
Pt presents with asthma flare up; wheezing and shortness of breathe.

## 2018-11-09 ENCOUNTER — Encounter (HOSPITAL_BASED_OUTPATIENT_CLINIC_OR_DEPARTMENT_OTHER): Payer: Self-pay | Admitting: *Deleted

## 2018-11-09 ENCOUNTER — Other Ambulatory Visit: Payer: Self-pay

## 2018-11-09 ENCOUNTER — Emergency Department (HOSPITAL_BASED_OUTPATIENT_CLINIC_OR_DEPARTMENT_OTHER)
Admission: EM | Admit: 2018-11-09 | Discharge: 2018-11-10 | Disposition: A | Discharge status: Home / Self Care | Payer: BC Managed Care – PPO | Attending: Emergency Medicine | Admitting: Emergency Medicine

## 2018-11-09 DIAGNOSIS — W010XXA Fall on same level from slipping, tripping and stumbling without subsequent striking against object, initial encounter: Secondary | ICD-10-CM | POA: Diagnosis not present

## 2018-11-09 DIAGNOSIS — J45909 Unspecified asthma, uncomplicated: Secondary | ICD-10-CM | POA: Diagnosis not present

## 2018-11-09 DIAGNOSIS — Y999 Unspecified external cause status: Secondary | ICD-10-CM | POA: Insufficient documentation

## 2018-11-09 DIAGNOSIS — F17290 Nicotine dependence, other tobacco product, uncomplicated: Secondary | ICD-10-CM | POA: Diagnosis not present

## 2018-11-09 DIAGNOSIS — S0990XA Unspecified injury of head, initial encounter: Secondary | ICD-10-CM

## 2018-11-09 DIAGNOSIS — Y92009 Unspecified place in unspecified non-institutional (private) residence as the place of occurrence of the external cause: Secondary | ICD-10-CM | POA: Diagnosis not present

## 2018-11-09 DIAGNOSIS — Z91010 Allergy to peanuts: Secondary | ICD-10-CM | POA: Diagnosis not present

## 2018-11-09 DIAGNOSIS — Y9383 Activity, rough housing and horseplay: Secondary | ICD-10-CM | POA: Diagnosis not present

## 2018-11-09 DIAGNOSIS — Z79899 Other long term (current) drug therapy: Secondary | ICD-10-CM | POA: Insufficient documentation

## 2018-11-09 MED ORDER — HYDROCODONE-ACETAMINOPHEN 5-325 MG PO TABS
1.0000 | ORAL_TABLET | Freq: Once | ORAL | Status: AC
  Administered 2018-11-10: 1 via ORAL
  Filled 2018-11-09: qty 1

## 2018-11-09 MED ORDER — NAPROXEN 500 MG PO TABS: 500.0000 mg | ORAL_TABLET | Freq: Two times a day (BID) | ORAL | 0 refills | Status: DC

## 2018-11-09 NOTE — Discharge Instructions (Addendum)
You were seen today after a fall.  You likely suffered a very mild head injury versus a mild concussion.  Make sure that you are resting and avoiding significant stimulus.

## 2018-11-09 NOTE — ED Provider Notes (Signed)
[facility] Provider Note   CSN: [id number] Arrival date & time: 11/09/18  2053     History   Chief Complaint Chief Complaint  Patient presents with  . Head Injury    HPI Nichole Curtis is a 23 y.o. female.     HPI  This is a 23 year old female who presents with a headache following a fall.  Patient reports that around 8:00 she was roughhousing on the couch.  She went to stand up and lost her balance falling backwards onto the floor.  She reports hitting her head on the carpeted floor.  She denies loss of consciousness.  Since that time she has developed a headache and nausea.  Patient rates her headache at 8 out of 10.  She has not had any vomiting.  She is not on any blood thinners.  She denies any other injury.  Denies neck pain.  She has not taken anything for her symptoms at home.  Past Medical History:  Diagnosis Date  . Anxiety   . Asthma     Patient Active Problem List   Diagnosis Date Noted  . Pregnancy 09/18/2015    History reviewed. No pertinent surgical history.   OB History    Gravida  1   Para  1   Term  1   Preterm      AB      Living  1     SAB      TAB      Ectopic      Multiple  0   Live Births  1            Home Medications    Prior to Admission medications   Medication Sig Start Date End Date Taking? Authorizing Provider  albuterol (PROVENTIL,VENTOLIN) 90 MCG/ACT inhaler Inhale 1-4 puffs into the lungs every 6 (six) hours as needed. Shortness of breath and wheezing      [provider]  beclomethasone (QVAR) 80 MCG/ACT inhaler Inhale 1 puff into the lungs daily.    [provider]  benzonatate (TESSALON) 100 MG capsule Take 1 capsule (100 mg total) by mouth every 8 (eight) hours. 03/21/18   Tasia Catchings, Amy V, PA-C  ibuprofen (ADVIL,MOTRIN) 800 MG tablet Take 1 tablet (800 mg total) by mouth every 8 (eight) hours as needed for moderate pain. 09/20/15   Morris, Avamae, DO   ipratropium (ATROVENT) 0.06 % nasal spray Place 2 sprays into both nostrils 4 (four) times daily. 03/21/18   Tasia Catchings, Amy V, PA-C  naproxen (NAPROSYN) 500 MG tablet Take 1 tablet (500 mg total) by mouth 2 (two) times daily. 11/09/18   , Barbette Hair, MD    Family History History reviewed. No pertinent family history.  Social History Social History   Tobacco Use  . Smoking status: Current Every Day Smoker    Types: E-cigarettes  . Smokeless tobacco: Never Used  Substance Use Topics  . Alcohol use: Yes    Comment: social   . Drug use: No     Allergies   Peanut-containing drug products and Penicillins   Review of Systems Review of Systems  Constitutional: Negative for fever.  Gastrointestinal: Positive for nausea. Negative for vomiting.  Musculoskeletal: Negative for neck pain.  Neurological: Positive for headaches. Negative for dizziness.  All other systems reviewed and are negative.    Physical Exam Updated Vital Signs BP 115/78   Pulse 74   Temp 98.1 F (36.7 C) (Oral)   Resp  16   Ht 1.575 m (5\' 2" )   Wt 74.8 kg   LMP 10/20/2018   SpO2 100%   BMI 30.18 kg/m   Physical Exam Vitals signs and nursing note reviewed.  Constitutional:      Appearance: She is well-developed. She is not ill-appearing.  HENT:     Head: Normocephalic and atraumatic.     Right Ear: Tympanic membrane normal.     Left Ear: Tympanic membrane normal.     Ears:     Comments: No hemotympanum Eyes:     Extraocular Movements: Extraocular movements intact.     Pupils: Pupils are equal, round, and reactive to light.  Neck:     Musculoskeletal: Normal range of motion and neck supple. No muscular tenderness.  Cardiovascular:     Rate and Rhythm: Normal rate and regular rhythm.     Heart sounds: Normal heart sounds.  Pulmonary:     Effort: Pulmonary effort is normal. No respiratory distress.     Breath sounds: No wheezing.  Abdominal:     Palpations: Abdomen is soft.     Tenderness:  There is no abdominal tenderness.  Musculoskeletal:        General: No deformity.  Skin:    General: Skin is warm and dry.  Neurological:     Mental Status: She is alert and oriented to person, place, and time.     Comments: Fluent speech, cranial nerves II through XII intact, 5 out of 5 strength in all 4 extremities  Psychiatric:        Mood and Affect: Mood normal.      ED Treatments / Results  Labs (all labs ordered are listed, but only abnormal results are displayed) Labs Reviewed - No data to display  EKG None  Radiology No results found.  Procedures Procedures (including critical care time)  Medications Ordered in ED Medications  HYDROcodone-acetaminophen (NORCO/VICODIN) 5-325 MG per tablet 1 tablet (has no administration in time range)     Initial Impression / Assessment and Plan / ED Course  I have reviewed the triage vital signs and the nursing notes.  Pertinent labs & imaging results that were available during my care of the patient were reviewed by me and considered in my medical decision making (see chart for details).        Patient presents with headache after fall.  Low risk mechanism.  Per [place] CT head rules she is low risk and does not warrant imaging at this time.  She likely suffered a minor head injury versus a mild concussion.  We discussed supportive measures at home including rest and decrease stimulation.  Naproxen as needed for pain.  After history, exam, and medical workup I feel the patient has been appropriately medically screened and is safe for discharge home. Pertinent diagnoses were discussed with the patient. Patient was given return precautions.   Final Clinical Impressions(s) / ED Diagnoses   Final diagnoses:  Minor head injury, initial encounter    ED Discharge Orders         Ordered    naproxen (NAPROSYN) 500 MG tablet  2 times daily     11/09/18 2352           , Mayer Maskerourtney F, MD 11/09/18 2355

## 2018-11-09 NOTE — ED Triage Notes (Signed)
Pt c/o fall from standing hitting head x 1 hr ago , denies LOC

## 2019-03-26 NOTE — L&D Delivery Note (Signed)
Delivery Note At 5:59 AM a viable female was delivered via  (Presentation:  Right Occiput Anterior).  APGAR: 6, 8; weight pending.   Placenta status: Spontaneous, Intact.  Cord: 3 vessels with the following complications: None.  Cord pH: not collected  Anesthesia: Epidural;Local Episiotomy: None Lacerations: 2nd degree Suture Repair: 3.0 vicryl rapide Est. Blood Loss (mL): 350  Mom to postpartum.  Baby to Couplet care / Skin to Skin.  Nichole Curtis 07/03/2019, 6:26 AM

## 2019-04-29 ENCOUNTER — Other Ambulatory Visit: Payer: Self-pay

## 2019-04-29 ENCOUNTER — Inpatient Hospital Stay (HOSPITAL_COMMUNITY)
Admission: AD | Admit: 2019-04-29 | Discharge: 2019-04-29 | Disposition: A | Discharge status: Home / Self Care | Payer: BC Managed Care – PPO | Attending: Obstetrics and Gynecology | Admitting: Obstetrics and Gynecology

## 2019-04-29 ENCOUNTER — Encounter (HOSPITAL_COMMUNITY): Payer: Self-pay | Admitting: Obstetrics and Gynecology

## 2019-04-29 ENCOUNTER — Inpatient Hospital Stay (HOSPITAL_COMMUNITY): Payer: BC Managed Care – PPO

## 2019-04-29 DIAGNOSIS — Z3689 Encounter for other specified antenatal screening: Secondary | ICD-10-CM

## 2019-04-29 DIAGNOSIS — Z87891 Personal history of nicotine dependence: Secondary | ICD-10-CM | POA: Insufficient documentation

## 2019-04-29 DIAGNOSIS — K219 Gastro-esophageal reflux disease without esophagitis: Secondary | ICD-10-CM | POA: Insufficient documentation

## 2019-04-29 DIAGNOSIS — O99613 Diseases of the digestive system complicating pregnancy, third trimester: Secondary | ICD-10-CM | POA: Diagnosis not present

## 2019-04-29 DIAGNOSIS — R1010 Upper abdominal pain, unspecified: Secondary | ICD-10-CM

## 2019-04-29 DIAGNOSIS — Z880 Allergy status to penicillin: Secondary | ICD-10-CM | POA: Insufficient documentation

## 2019-04-29 DIAGNOSIS — O26893 Other specified pregnancy related conditions, third trimester: Secondary | ICD-10-CM | POA: Diagnosis not present

## 2019-04-29 DIAGNOSIS — Z3A28 28 weeks gestation of pregnancy: Secondary | ICD-10-CM

## 2019-04-29 DIAGNOSIS — R1011 Right upper quadrant pain: Secondary | ICD-10-CM | POA: Diagnosis present

## 2019-04-29 HISTORY — DX: Headache, unspecified: R51.9

## 2019-04-29 HISTORY — DX: Dermatitis, unspecified: L30.9

## 2019-04-29 LAB — COMPREHENSIVE METABOLIC PANEL
ALT: 26 U/L (ref 0–44)
AST: 22 U/L (ref 15–41)
Albumin: 2.6 g/dL — ABNORMAL LOW (ref 3.5–5.0)
Alkaline Phosphatase: 110 U/L (ref 38–126)
Anion gap: 10 (ref 5–15)
BUN: 9 mg/dL (ref 6–20)
CO2: 20 mmol/L — ABNORMAL LOW (ref 22–32)
Calcium: 8.3 mg/dL — ABNORMAL LOW (ref 8.9–10.3)
Chloride: 105 mmol/L (ref 98–111)
Creatinine, Ser: 0.44 mg/dL (ref 0.44–1.00)
GFR calc Af Amer: >60 mL/min (ref >60)
GFR calc non Af Amer: >60 mL/min (ref >60)
Glucose, Bld: 83 mg/dL (ref 70–99)
Potassium: 3.8 mmol/L (ref 3.5–5.1)
Sodium: 135 mmol/L (ref 135–145)
Total Bilirubin: 0.5 mg/dL (ref 0.3–1.2)
Total Protein: 6 g/dL — ABNORMAL LOW (ref 6.5–8.1)

## 2019-04-29 LAB — URINALYSIS, ROUTINE W REFLEX MICROSCOPIC
Bilirubin Urine: NEGATIVE
Glucose, UA: NEGATIVE mg/dL
Hgb urine dipstick: NEGATIVE
Ketones, ur: NEGATIVE mg/dL
Leukocytes,Ua: NEGATIVE
Nitrite: NEGATIVE
Protein, ur: NEGATIVE mg/dL
Specific Gravity, Urine: 1.024 (ref 1.005–1.030)
pH: 6 (ref 5.0–8.0)

## 2019-04-29 LAB — WET PREP, GENITAL
Clue Cells Wet Prep HPF POC: NONE SEEN
Sperm: NONE SEEN
Trich, Wet Prep: NONE SEEN
Yeast Wet Prep HPF POC: NONE SEEN

## 2019-04-29 LAB — CBC
HCT: 36.8 % (ref 36.0–46.0)
Hemoglobin: 11.9 g/dL — ABNORMAL LOW (ref 12.0–15.0)
MCH: 27.2 pg (ref 26.0–34.0)
MCHC: 32.3 g/dL (ref 30.0–36.0)
MCV: 84.2 fL (ref 80.0–100.0)
Platelets: 270 10*3/uL (ref 150–400)
RBC: 4.37 MIL/uL (ref 3.87–5.11)
RDW: 13 % (ref 11.5–15.5)
WBC: 9.4 10*3/uL (ref 4.0–10.5)
nRBC: 0 % (ref 0.0–0.2)

## 2019-04-29 LAB — LIPASE, BLOOD: Lipase: 21 U/L (ref 11–51)

## 2019-04-29 MED ORDER — LIDOCAINE VISCOUS HCL 2 % MT SOLN
15.0000 mL | Freq: Once | OROMUCOSAL | Status: AC
  Administered 2019-04-29: 15 mL via ORAL
  Filled 2019-04-29: qty 15

## 2019-04-29 MED ORDER — ALUM & MAG HYDROXIDE-SIMETH 200-200-20 MG/5ML PO SUSP
30.0000 mL | Freq: Once | ORAL | Status: AC
  Administered 2019-04-29: 11:00:00 30 mL via ORAL
  Filled 2019-04-29: qty 30

## 2019-04-29 NOTE — MAU Provider Note (Signed)
History     CSN: [id number]  Arrival date and time: 04/29/19 0907   First Provider Initiated Contact with Patient 04/29/19 1007      Chief Complaint  Patient presents with  . Contractions   23 y.o. G2P1001 @28 .1 wks presenting with upper abdominal pain. Pain onset this am. Describes as sharp and intermittent. She thinks it may be ctx. Feels some tightening. Had 2 episodes of emesis. She was able to tolerate breakfast. Denies diarrhea or fever. Denies urinary sx. No VB, LOF, or vaginal discharge. +FM. Reports hx of GERD.   OB History    Gravida  2   Para  1   Term  1   Preterm      AB      Living  1     SAB      TAB      Ectopic      Multiple  0   Live Births  1           Past Medical History:  Diagnosis Date  . Anxiety    when her dad died  . Asthma   . Eczema   . Headache   . Infection    UTI    Past Surgical History:  Procedure Laterality Date  . NO PAST SURGERIES      Family History  Problem Relation Age of Onset  . Heart disease Mother   . Hypertension Mother   . Heart disease Father     Social History   Tobacco Use  . Smoking status: Former Smoker    Types: E-cigarettes  . Smokeless tobacco: Never Used  . Tobacco comment: quit with preg 11-2018  Substance Use Topics  . Alcohol use: Not Currently    Comment: social   . Drug use: No    Allergies:  Allergies  Allergen Reactions  . Peanut-Containing Drug Products   . Penicillins Hives and Rash    Has patient had a PCN reaction causing immediate rash, facial/tongue/throat swelling, SOB or lightheadedness with hypotension: No Has patient had a PCN reaction causing severe rash involving mucus membranes or skin necrosis: No Has patient had a PCN reaction that required hospitalization No Has patient had a PCN reaction occurring within the last 10 years: Yes If all of the above answers are "NO", then may proceed with Cephalosporin use.     No medications prior to admission.     Review of Systems  Constitutional: Negative for chills and fever.  Gastrointestinal: Positive for abdominal pain, nausea and vomiting. Negative for constipation and diarrhea.  Genitourinary: Negative for dysuria, frequency, hematuria, urgency, vaginal bleeding and vaginal discharge.   Physical Exam   Blood pressure 123/68, pulse 96, temperature 98.6 F (37 C), temperature source Oral, resp. rate 18, height 5\' 2"  (1.575 m), weight 87.9 kg, last menstrual period 10/20/2018, SpO2 97 %, unknown if currently breastfeeding.  Physical Exam  Nursing note and vitals reviewed. Constitutional: She is oriented to person, place, and time. She appears well-developed and well-nourished. No distress.  HENT:  Head: Normocephalic and atraumatic.  Cardiovascular: Normal rate.  Respiratory: Effort normal. No respiratory distress.  GI: Soft. She exhibits no distension. There is abdominal tenderness in the epigastric area. There is no rebound and no guarding.  gravid  Genitourinary:    Genitourinary Comments: VE: closed/thick   Musculoskeletal:        General: Normal range of motion.     Cervical back: Normal range of motion.  Neurological: She is  alert and oriented to person, place, and time.  Skin: Skin is warm and dry.  Psychiatric: She has a normal mood and affect.  EFM: 140 bpm, mod variability, + accels, no decels Toco: none  Results for orders placed or performed during the hospital encounter of 04/29/19 (from the past 24 hour(s))  Urinalysis, Routine w reflex microscopic     Status: Abnormal   Collection Time: 04/29/19  9:35 AM  Result Value Ref Range   Color, Urine YELLOW YELLOW   APPearance HAZY (A) CLEAR   Specific Gravity, Urine 1.024 1.005 - 1.030   pH 6.0 5.0 - 8.0   Glucose, UA NEGATIVE NEGATIVE mg/dL   Hgb urine dipstick NEGATIVE NEGATIVE   Bilirubin Urine NEGATIVE NEGATIVE   Ketones, ur NEGATIVE NEGATIVE mg/dL   Protein, ur NEGATIVE NEGATIVE mg/dL   Nitrite NEGATIVE  NEGATIVE   Leukocytes,Ua NEGATIVE NEGATIVE  Wet prep, genital     Status: Abnormal   Collection Time: 04/29/19 10:18 AM   Specimen: Vaginal  Result Value Ref Range   Yeast Wet Prep HPF POC NONE SEEN NONE SEEN   Trich, Wet Prep NONE SEEN NONE SEEN   Clue Cells Wet Prep HPF POC NONE SEEN NONE SEEN   WBC, Wet Prep HPF POC MANY (A) NONE SEEN   Sperm NONE SEEN   CBC     Status: Abnormal   Collection Time: 04/29/19 10:46 AM  Result Value Ref Range   WBC 9.4 4.0 - 10.5 K/uL   RBC 4.37 3.87 - 5.11 MIL/uL   Hemoglobin 11.9 (L) 12.0 - 15.0 g/dL   HCT 54.0 08.6 - 76.1 %   MCV 84.2 80.0 - 100.0 fL   MCH 27.2 26.0 - 34.0 pg   MCHC 32.3 30.0 - 36.0 g/dL   RDW 95.0 93.2 - 67.1 %   Platelets 270 150 - 400 K/uL   nRBC 0.0 0.0 - 0.2 %  Comprehensive metabolic panel     Status: Abnormal   Collection Time: 04/29/19 10:46 AM  Result Value Ref Range   Sodium 135 135 - 145 mmol/L   Potassium 3.8 3.5 - 5.1 mmol/L   Chloride 105 98 - 111 mmol/L   CO2 20 (L) 22 - 32 mmol/L   Glucose, Bld 83 70 - 99 mg/dL   BUN 9 6 - 20 mg/dL   Creatinine, Ser 2.45 0.44 - 1.00 mg/dL   Calcium 8.3 (L) 8.9 - 10.3 mg/dL   Total Protein 6.0 (L) 6.5 - 8.1 g/dL   Albumin 2.6 (L) 3.5 - 5.0 g/dL   AST 22 15 - 41 U/L   ALT 26 0 - 44 U/L   Alkaline Phosphatase 110 38 - 126 U/L   Total Bilirubin 0.5 0.3 - 1.2 mg/dL   GFR calc non Af Amer >60 >60 mL/min   GFR calc Af Amer >60 >60 mL/min   Anion gap 10 5 - 15  Lipase, blood     Status: None   Collection Time: 04/29/19 10:46 AM  Result Value Ref Range   Lipase 21 11 - 51 U/L   US ABDOMEN LIMITED RUQ  Result Date: 04/29/2019 CLINICAL DATA:  Upper abdominal pain.  Patient is [redacted] weeks pregnant. EXAM: ULTRASOUND ABDOMEN LIMITED RIGHT UPPER QUADRANT COMPARISON:  None. FINDINGS: Gallbladder: No gallstones or wall thickening visualized. No sonographic Murphy sign noted by sonographer. Common bile duct: Diameter: 1.9 mm Liver: Normal echogenicity without focal lesion or biliary  dilatation. Portal vein is patent on color Doppler imaging with normal  direction of blood flow towards the liver. Other: None. IMPRESSION: Normal right upper quadrant ultrasound examination Electronically Signed   By: Rudie Meyer M.D.   On: 04/29/2019 12:15    MAU Course  Procedures Meds ordered this encounter  Medications  . AND Linked Order Group   . alum & mag hydroxide-simeth (MAALOX/MYLANTA) 200-200-20 MG/5ML suspension 30 mL   . lidocaine (XYLOCAINE) 2 % viscous mouth solution 15 mL   MDM Labs and [place] ordered and reviewed. Sx somewhat improved after med. Suspect GERD and/or dyspepsia. No evidence of PTL or acute abd process. Stable for discharge home.  Assessment and Plan   1. [redacted] weeks gestation of pregnancy   2. Upper abdominal pain   3. NST (non-stress test) reactive   4. Gastroesophageal reflux disease without esophagitis    Discharge home Follow up at [facility] next week as scheduled GERD treatments PTL precautions  Allergies as of 04/29/2019      Reactions   Peanut-containing Drug Products    Penicillins Hives, Rash   Has patient had a PCN reaction causing immediate rash, facial/tongue/throat swelling, SOB or lightheadedness with hypotension: No Has patient had a PCN reaction causing severe rash involving mucus membranes or skin necrosis: No Has patient had a PCN reaction that required hospitalization No Has patient had a PCN reaction occurring within the last 10 years: Yes If all of the above answers are "NO", then may proceed with Cephalosporin use.      Medication List    STOP taking these medications   benzonatate 100 MG capsule Commonly known as: TESSALON   ibuprofen 800 MG tablet Commonly known as: ADVIL   ipratropium 0.06 % nasal spray Commonly known as: Atrovent   naproxen 500 MG tablet Commonly known as: NAPROSYN     TAKE these medications   acetaminophen 325 MG tablet Commonly known as: TYLENOL Take 1,000 mg by mouth every 6 (six)  hours as needed.   albuterol 90 MCG/ACT inhaler Commonly known as: PROVENTIL,VENTOLIN Inhale 1-4 puffs into the lungs every 6 (six) hours as needed. Shortness of breath and wheezing   beclomethasone 80 MCG/ACT inhaler Commonly known as: QVAR Inhale 1 puff into the lungs 2 (two) times daily.   multivitamin-prenatal 27-0.8 MG Tabs tablet Take 1 tablet by mouth daily at 12 noon.      Donette Larry, CNM 04/29/2019, 1:20 PM

## 2019-04-29 NOTE — MAU Note (Signed)
Started having pains in upper abd around 0630, comes and goes, feels like contractions. No bleeding or leaking. No hx of PTL

## 2019-04-29 NOTE — Discharge Instructions (Signed)

## 2019-04-30 LAB — GC/CHLAMYDIA PROBE AMP (~~LOC~~) NOT AT ARMC
Chlamydia: NEGATIVE
Comment: NEGATIVE
Comment: NORMAL
Neisseria Gonorrhea: NEGATIVE

## 2019-05-27 ENCOUNTER — Inpatient Hospital Stay (HOSPITAL_COMMUNITY)
Admission: AD | Admit: 2019-05-27 | Discharge: 2019-05-28 | Disposition: A | Discharge status: Home / Self Care | Payer: BC Managed Care – PPO | Attending: Obstetrics and Gynecology | Admitting: Obstetrics and Gynecology

## 2019-05-27 ENCOUNTER — Encounter (HOSPITAL_COMMUNITY): Payer: Self-pay | Admitting: Obstetrics and Gynecology

## 2019-05-27 ENCOUNTER — Other Ambulatory Visit: Payer: Self-pay

## 2019-05-27 DIAGNOSIS — Z880 Allergy status to penicillin: Secondary | ICD-10-CM | POA: Diagnosis not present

## 2019-05-27 DIAGNOSIS — R102 Pelvic and perineal pain: Secondary | ICD-10-CM | POA: Diagnosis present

## 2019-05-27 DIAGNOSIS — Z3A32 32 weeks gestation of pregnancy: Secondary | ICD-10-CM | POA: Insufficient documentation

## 2019-05-27 DIAGNOSIS — O479 False labor, unspecified: Secondary | ICD-10-CM | POA: Diagnosis present

## 2019-05-27 DIAGNOSIS — O4703 False labor before 37 completed weeks of gestation, third trimester: Secondary | ICD-10-CM

## 2019-05-27 DIAGNOSIS — O4700 False labor before 37 completed weeks of gestation, unspecified trimester: Secondary | ICD-10-CM | POA: Diagnosis present

## 2019-05-27 DIAGNOSIS — Z87891 Personal history of nicotine dependence: Secondary | ICD-10-CM | POA: Diagnosis not present

## 2019-05-27 DIAGNOSIS — O26893 Other specified pregnancy related conditions, third trimester: Secondary | ICD-10-CM | POA: Diagnosis not present

## 2019-05-27 LAB — URINALYSIS, ROUTINE W REFLEX MICROSCOPIC
Bilirubin Urine: NEGATIVE
Glucose, UA: NEGATIVE mg/dL
Hgb urine dipstick: NEGATIVE
Ketones, ur: 5 mg/dL — AB
Nitrite: NEGATIVE
Protein, ur: NEGATIVE mg/dL
Specific Gravity, Urine: 1.02 (ref 1.005–1.030)
pH: 6 (ref 5.0–8.0)

## 2019-05-27 LAB — FETAL FIBRONECTIN: Fetal Fibronectin: NEGATIVE

## 2019-05-27 MED ORDER — NIFEDIPINE 10 MG PO CAPS
10.0000 mg | ORAL_CAPSULE | ORAL | Status: DC | PRN
  Administered 2019-05-27 (×3): 10 mg via ORAL
  Filled 2019-05-27 (×3): qty 1

## 2019-05-27 NOTE — MAU Provider Note (Signed)
Chief Complaint:  Pelvic Pain   First Provider Initiated Contact with Patient 05/27/19 2203     HPI: Nichole Curtis is a 24 y.o. G2P1001 at 25w1dwho presents to maternity admissions reporting pelvic pressure this evening.  It tends to come and go.  Also has low back pain. . She reports good fetal movement, denies LOF, vaginal bleeding, vaginal itching/burning, urinary symptoms, h/a, dizziness, n/v, diarrhea, constipation or fever/chills.  She denies headache, visual changes or RUQ abdominal pain.  Pelvic Pain The patient's primary symptoms include pelvic pain. The patient's pertinent negatives include no genital itching, genital lesions, genital odor, vaginal bleeding or vaginal discharge. This is a new problem. The current episode started today. The problem occurs intermittently. The problem has been unchanged. The pain is mild. She is pregnant. Associated symptoms include abdominal pain. Pertinent negatives include no chills, constipation, diarrhea, dysuria, fever, frequency, nausea or vomiting. Nothing aggravates the symptoms. She has tried nothing for the symptoms.    RN Note; Having a lot of pelvic pressure in lower abd this evening. Pressure comes and goes. Denies VB or LOF. Some lower back pain.   Past Medical History: Past Medical History:  Diagnosis Date  . Anxiety    when her dad died  . Asthma   . Eczema   . Headache   . Infection    UTI    Past obstetric history: OB History  Gravida Para Term Preterm AB Living  2 1 1     1   SAB TAB Ectopic Multiple Live Births        0 1    # Outcome Date GA Lbr Len/2nd Weight Sex Delivery Anes PTL Lv  2 Current           1 Term 09/18/15 [redacted]w[redacted]d 11:30 / 02:24 3190 g F Vag-Spont EPI  LIV    Past Surgical History: Past Surgical History:  Procedure Laterality Date  . NO PAST SURGERIES      Family History: Family History  Problem Relation Age of Onset  . Heart disease Mother   . Hypertension Mother   . Heart disease Father      Social History: Social History   Tobacco Use  . Smoking status: Former Smoker    Types: E-cigarettes  . Smokeless tobacco: Never Used  . Tobacco comment: quit with preg 11-2018  Substance Use Topics  . Alcohol use: Not Currently    Comment: social   . Drug use: No    Allergies:  Allergies  Allergen Reactions  . Peanut-Containing Drug Products   . Penicillins Hives and Rash    Has patient had a PCN reaction causing immediate rash, facial/tongue/throat swelling, SOB or lightheadedness with hypotension: No Has patient had a PCN reaction causing severe rash involving mucus membranes or skin necrosis: No Has patient had a PCN reaction that required hospitalization No Has patient had a PCN reaction occurring within the last 10 years: Yes If all of the above answers are "NO", then may proceed with Cephalosporin use.     Meds:  Medications Prior to Admission  Medication Sig Dispense Refill Last Dose  . acetaminophen (TYLENOL) 325 MG tablet Take 1,000 mg by mouth every 6 (six) hours as needed.   Past Week at Unknown time  . albuterol (PROVENTIL,VENTOLIN) 90 MCG/ACT inhaler Inhale 1-4 puffs into the lungs every 6 (six) hours as needed. Shortness of breath and wheezing     05/27/2019 at Unknown time  . beclomethasone (QVAR) 80 MCG/ACT inhaler Inhale 1  puff into the lungs 2 (two) times daily.    05/27/2019 at Unknown time  . Prenatal Vit-Fe Fumarate-FA (MULTIVITAMIN-PRENATAL) 27-0.8 MG TABS tablet Take 1 tablet by mouth daily at 12 noon.   05/27/2019 at Unknown time    I have reviewed patient's Past Medical Hx, Surgical Hx, Family Hx, Social Hx, medications and allergies.   ROS:  Review of Systems  Constitutional: Negative for chills and fever.  Gastrointestinal: Positive for abdominal pain. Negative for constipation, diarrhea, nausea and vomiting.  Genitourinary: Positive for pelvic pain. Negative for dysuria, frequency and vaginal discharge.   Other systems negative  Physical  Exam   Patient Vitals for the past 24 hrs:  BP Temp Pulse Resp Height Weight  05/27/19 2123 116/65 -- 86 -- -- --  05/27/19 2119 -- 98 F (36.7 C) -- 18 5\' 2"  (1.575 m) 87.5 kg   Constitutional: Well-developed, well-nourished female in no acute distress.  Cardiovascular: normal rate and rhythm Respiratory: normal effort, clear to auscultation bilaterally GI: Abd soft, non-tender, gravid appropriate for gestational age.   No rebound or guarding. MS: Extremities nontender, no edema, normal ROM Neurologic: Alert and oriented x 4.  GU: Neg CVAT.  PELVIC EXAM:  Dilation: Closed Effacement (%): Thick Exam by:: Hansel Feinstein CNM   FHT:  Baseline 140 , moderate variability, accelerations present, no decelerations Contractions: q 3 mins Irregular     Labs: Results for orders placed or performed during the hospital encounter of 05/27/19 (from the past 24 hour(s))  Urinalysis, Routine w reflex microscopic     Status: Abnormal   Collection Time: 05/27/19 10:01 PM  Result Value Ref Range   Color, Urine YELLOW YELLOW   APPearance HAZY (A) CLEAR   Specific Gravity, Urine 1.020 1.005 - 1.030   pH 6.0 5.0 - 8.0   Glucose, UA NEGATIVE NEGATIVE mg/dL   Hgb urine dipstick NEGATIVE NEGATIVE   Bilirubin Urine NEGATIVE NEGATIVE   Ketones, ur 5 (A) NEGATIVE mg/dL   Protein, ur NEGATIVE NEGATIVE mg/dL   Nitrite NEGATIVE NEGATIVE   Leukocytes,Ua TRACE (A) NEGATIVE   RBC / HPF 0-5 0 - 5 RBC/hpf   WBC, UA 6-10 0 - 5 WBC/hpf   Bacteria, UA RARE (A) NONE SEEN   Squamous Epithelial / LPF 11-20 0 - 5   Mucus PRESENT   Fetal fibronectin     Status: None   Collection Time: 05/27/19 10:04 PM  Result Value Ref Range   Fetal Fibronectin NEGATIVE NEGATIVE     Imaging:    MAU Course/MDM: I have ordered labs and reviewed results. Fetal fibronectin is negative NST reviewed, reactive with frequent contractions.  .  Treatments in MAU included Procardia series x 3 doses.  Uterine contractions quieted  after third dose Discussed negative fetal fibronectin Reviewed signs of preterm labor to come back for.    Assessment: Single IUP at [redacted]w[redacted]d Preterm uterine contractions with negative fetal fibronectin  Plan: Discharge home Preterm Labor precautions and fetal kick counts Follow up in Office for prenatal visits and recheck of status  Encouraged to return here or to other Urgent Care/ED if she develops worsening of symptoms, increase in pain, fever, or other concerning symptoms.   Pt stable at time of discharge.  Hansel Feinstein CNM, MSN Certified Nurse-Midwife 05/27/2019 10:03 PM

## 2019-05-27 NOTE — Progress Notes (Addendum)
STates feels better and not having pelvic pressure like before. Baby very active. OK to d/c tranducer and just keep toco on for now since FHR tracing is reactive.

## 2019-05-27 NOTE — Progress Notes (Addendum)
Pt was sitting up in bed and tracing maternal HR. Transducer adjusted. PT states she is feeling better and less cramping

## 2019-05-27 NOTE — MAU Note (Signed)
Having a lot of pelvic pressure in lower abd this evening. Pressure comes and goes. Denies VB or LOF. Some lower back pain.

## 2019-05-28 DIAGNOSIS — O4703 False labor before 37 completed weeks of gestation, third trimester: Secondary | ICD-10-CM | POA: Diagnosis not present

## 2019-05-28 DIAGNOSIS — O4700 False labor before 37 completed weeks of gestation, unspecified trimester: Secondary | ICD-10-CM | POA: Diagnosis present

## 2019-05-28 DIAGNOSIS — J4540 Moderate persistent asthma, uncomplicated: Secondary | ICD-10-CM | POA: Insufficient documentation

## 2019-05-28 DIAGNOSIS — O479 False labor, unspecified: Secondary | ICD-10-CM | POA: Diagnosis present

## 2019-05-28 DIAGNOSIS — Z3A32 32 weeks gestation of pregnancy: Secondary | ICD-10-CM

## 2019-05-28 NOTE — Progress Notes (Signed)
Marie Williams CNM in earlier to discuss d/c plan. Written and verbal d/c instructions given and understanding voiced. 

## 2019-05-28 NOTE — Discharge Instructions (Signed)
Preterm Labor and Birth Information ° °The normal length of a pregnancy is 39-41 weeks. Preterm labor is when labor starts before 37 completed weeks of pregnancy. °What are the risk factors for preterm labor? °Preterm labor is more likely to occur in women who: °· Have certain infections during pregnancy such as a bladder infection, sexually transmitted infection, or infection inside the uterus (chorioamnionitis). °· Have a shorter-than-normal cervix. °· Have gone into preterm labor before. °· Have had surgery on their cervix. °· Are younger than age 17 or older than age 35. °· Are African American. °· Are pregnant with twins or multiple babies (multiple gestation). °· Take street drugs or smoke while pregnant. °· Do not gain enough weight while pregnant. °· Became pregnant shortly after having been pregnant. °What are the symptoms of preterm labor? °Symptoms of preterm labor include: °· Cramps similar to those that can happen during a menstrual period. The cramps may happen with diarrhea. °· Pain in the abdomen or lower back. °· Regular uterine contractions that may feel like tightening of the abdomen. °· A feeling of increased pressure in the pelvis. °· Increased watery or bloody mucus discharge from the vagina. °· Water breaking (ruptured amniotic sac). °Why is it important to recognize signs of preterm labor? °It is important to recognize signs of preterm labor because babies who are born prematurely may not be fully developed. This can put them at an increased risk for: °· Long-term (chronic) heart and lung problems. °· Difficulty immediately after birth with regulating body systems, including blood sugar, body temperature, heart rate, and breathing rate. °· Bleeding in the brain. °· Cerebral palsy. °· Learning difficulties. °· Death. °These risks are highest for babies who are born before 34 weeks of pregnancy. °How is preterm labor treated? °Treatment depends on the length of your pregnancy, your condition,  and the health of your baby. It may involve: °· Having a stitch (suture) placed in your cervix to prevent your cervix from opening too early (cerclage). °· Taking or being given medicines, such as: °? Hormone medicines. These may be given early in pregnancy to help support the pregnancy. °? Medicine to stop contractions. °? Medicines to help mature the baby’s lungs. These may be prescribed if the risk of delivery is high. °? Medicines to prevent your baby from developing cerebral palsy. °If the labor happens before 34 weeks of pregnancy, you may need to stay in the hospital. °What should I do if I think I am in preterm labor? °If you think that you are going into preterm labor, call your health care provider right away. °How can I prevent preterm labor in future pregnancies? °To increase your chance of having a full-term pregnancy: °· Do not use any tobacco products, such as cigarettes, chewing tobacco, and e-cigarettes. If you need help quitting, ask your health care provider. °· Do not use street drugs or medicines that have not been prescribed to you during your pregnancy. °· Talk with your health care provider before taking any herbal supplements, even if you have been taking them regularly. °· Make sure you gain a healthy amount of weight during your pregnancy. °· Watch for infection. If you think that you might have an infection, get it checked right away. °· Make sure to tell your health care provider if you have gone into preterm labor before. °This information is not intended to replace advice given to you by your health care provider. Make sure you discuss any questions you have with your   health care provider. °Document Revised: 07/03/2018 Document Reviewed: 08/02/2015 °Elsevier Patient Education © 2020 [employer] ° °

## 2019-05-31 ENCOUNTER — Inpatient Hospital Stay (HOSPITAL_COMMUNITY)
Admission: AD | Admit: 2019-05-31 | Discharge: 2019-06-01 | Disposition: A | Discharge status: Home / Self Care | Payer: BC Managed Care – PPO | Source: Ambulatory Visit | Attending: Obstetrics & Gynecology | Admitting: Obstetrics & Gynecology

## 2019-05-31 ENCOUNTER — Encounter (HOSPITAL_COMMUNITY): Payer: Self-pay | Admitting: Obstetrics & Gynecology

## 2019-05-31 ENCOUNTER — Other Ambulatory Visit: Payer: Self-pay

## 2019-05-31 DIAGNOSIS — Z87891 Personal history of nicotine dependence: Secondary | ICD-10-CM | POA: Insufficient documentation

## 2019-05-31 DIAGNOSIS — M545 Low back pain: Secondary | ICD-10-CM | POA: Insufficient documentation

## 2019-05-31 DIAGNOSIS — O4703 False labor before 37 completed weeks of gestation, third trimester: Secondary | ICD-10-CM

## 2019-05-31 DIAGNOSIS — O26893 Other specified pregnancy related conditions, third trimester: Secondary | ICD-10-CM | POA: Insufficient documentation

## 2019-05-31 DIAGNOSIS — Z3A32 32 weeks gestation of pregnancy: Secondary | ICD-10-CM | POA: Diagnosis not present

## 2019-05-31 DIAGNOSIS — O479 False labor, unspecified: Secondary | ICD-10-CM

## 2019-05-31 DIAGNOSIS — Z880 Allergy status to penicillin: Secondary | ICD-10-CM | POA: Diagnosis not present

## 2019-05-31 DIAGNOSIS — O4700 False labor before 37 completed weeks of gestation, unspecified trimester: Secondary | ICD-10-CM

## 2019-05-31 LAB — URINALYSIS, ROUTINE W REFLEX MICROSCOPIC
Bilirubin Urine: NEGATIVE
Glucose, UA: NEGATIVE mg/dL
Hgb urine dipstick: NEGATIVE
Ketones, ur: NEGATIVE mg/dL
Nitrite: NEGATIVE
Protein, ur: NEGATIVE mg/dL
Specific Gravity, Urine: 1.006 (ref 1.005–1.030)
pH: 7 (ref 5.0–8.0)

## 2019-05-31 MED ORDER — NIFEDIPINE 10 MG PO CAPS
10.0000 mg | ORAL_CAPSULE | ORAL | Status: AC | PRN
  Administered 2019-05-31 (×3): 10 mg via ORAL
  Filled 2019-05-31 (×3): qty 1

## 2019-05-31 MED ORDER — CYCLOBENZAPRINE HCL 5 MG PO TABS
10.0000 mg | ORAL_TABLET | Freq: Once | ORAL | Status: AC
  Administered 2019-05-31: 10 mg via ORAL
  Filled 2019-05-31: qty 2

## 2019-05-31 MED ORDER — LACTATED RINGERS IV BOLUS
1000.0000 mL | Freq: Once | INTRAVENOUS | Status: AC
  Administered 2019-05-31: 1000 mL via INTRAVENOUS

## 2019-05-31 NOTE — MAU Note (Signed)
.   Nichole Curtis is a 24 y.o. at [redacted]w[redacted]d here in MAU reporting: Patient reports that she is having back pain and pelvic pressure that started around 1300. She attempted to drink water and lay on her sides and it did not help. She states that she was here with the same complaint last Thursday and she had procardia. No VB or LOF. Patient endorses good fetal movement. Patient had intercourse last night.   Pain score: 5 Vitals:   05/31/19 2253  BP: 114/68  Pulse: 99  Resp: 14  Temp: (!) 97.4 F (36.3 C)  SpO2: 100%     FHT:137 Lab orders placed from triage:

## 2019-06-01 DIAGNOSIS — O26893 Other specified pregnancy related conditions, third trimester: Secondary | ICD-10-CM | POA: Diagnosis not present

## 2019-06-01 DIAGNOSIS — Z3A32 32 weeks gestation of pregnancy: Secondary | ICD-10-CM

## 2019-06-01 DIAGNOSIS — O4703 False labor before 37 completed weeks of gestation, third trimester: Secondary | ICD-10-CM

## 2019-06-01 LAB — CULTURE, OB URINE: Culture: 20000 — AB

## 2019-06-01 MED ORDER — CYCLOBENZAPRINE HCL 10 MG PO TABS: 10.0000 mg | ORAL_TABLET | Freq: Two times a day (BID) | ORAL | 0 refills | Status: DC | PRN

## 2019-06-01 MED ORDER — NIFEDIPINE 10 MG PO CAPS
10.0000 mg | ORAL_CAPSULE | Freq: Once | ORAL | Status: DC
  Filled 2019-06-01: qty 1

## 2019-06-01 MED ORDER — OXYCODONE HCL 5 MG PO TABS
10.0000 mg | ORAL_TABLET | Freq: Once | ORAL | Status: AC
  Administered 2019-06-01: 10 mg via ORAL
  Filled 2019-06-01: qty 2

## 2019-06-01 MED ORDER — LACTATED RINGERS IV BOLUS
1000.0000 mL | Freq: Once | INTRAVENOUS | Status: AC
  Administered 2019-06-01: 1000 mL via INTRAVENOUS

## 2019-06-01 NOTE — MAU Provider Note (Signed)
History     CSN: [id number]  Arrival date and time: 05/31/19 2230   First Provider Initiated Contact with Patient 05/31/19 2306      Chief Complaint  Patient presents with  . Pelvic Pressure  . Back Pain   HPI Nichole Curtis is a 24 y.o. G2P1001 at [redacted]w[redacted]d who presents to MAU with chief complaint of mid and lower back pain as well as pelvic pressure. These are recurrent problems for which patient was evaluated on 05/27/2019. Her current episode began 05/31/2019 around 1300 hours. She attempted to treat her pain with rest and hydration. She did not experience relief. On arrival to MAU she rates her abdominal and back pain as 5/10. She denies vaginal bleeding, leaking of fluid, decreased fetal movement, fever, falls, or recent illness.   Patient denies pain during initial provider introduction and initial exam at 2230 hours.  She receives prenatal care with [facility] and her next appointment is 06/03/2019.  OB History    Gravida  2   Para  1   Term  1   Preterm      AB      Living  1     SAB      TAB      Ectopic      Multiple  0   Live Births  1           Past Medical History:  Diagnosis Date  . Anxiety    when her dad died  . Asthma   . Eczema   . Headache   . Infection    UTI    Past Surgical History:  Procedure Laterality Date  . NO PAST SURGERIES      Family History  Problem Relation Age of Onset  . Heart disease Mother   . Hypertension Mother   . Heart disease Father     Social History   Tobacco Use  . Smoking status: Former Smoker    Types: E-cigarettes  . Smokeless tobacco: Never Used  . Tobacco comment: quit with preg 11-2018  Substance Use Topics  . Alcohol use: Not Currently    Comment: social   . Drug use: No    Allergies:  Allergies  Allergen Reactions  . Other Itching and Anaphylaxis    And [place]  . Penicillins Hives and Rash    Has patient had a PCN reaction causing immediate rash,  facial/tongue/throat swelling, SOB or lightheadedness with hypotension: No Has patient had a PCN reaction causing severe rash involving mucus membranes or skin necrosis: No Has patient had a PCN reaction that required hospitalization No Has patient had a PCN reaction occurring within the last 10 years: Yes If all of the above answers are "NO", then may proceed with Cephalosporin use.  Has patient had a PCN reaction causing immediate rash, facial/tongue/throat swelling, SOB or lightheadedness with hypotension: No Has patient had a PCN reaction causing severe rash involving mucus membranes or skin necrosis: No Has patient had a PCN reaction that required hospitalization No Has patient had a PCN reaction occurring within the last 10 years: Yes If all of the above answers are "NO", then may proceed with Cephalosporin use.  Marland Kitchen Peanut-Containing Drug Products     Medications Prior to Admission  Medication Sig Dispense Refill Last Dose  . acetaminophen (TYLENOL) 325 MG tablet Take 1,000 mg by mouth every 6 (six) hours as needed.   05/30/2019 at Unknown time  . albuterol (PROVENTIL,VENTOLIN) 90 MCG/ACT inhaler  Inhale 1-4 puffs into the lungs every 6 (six) hours as needed. Shortness of breath and wheezing     05/31/2019 at Unknown time  . beclomethasone (QVAR) 80 MCG/ACT inhaler Inhale 1 puff into the lungs 2 (two) times daily.    05/31/2019 at Unknown time  . montelukast (SINGULAIR) 10 MG tablet Take 10 mg by mouth at bedtime.   05/30/2019 at Unknown time  . Prenatal Vit-Fe Fumarate-FA (MULTIVITAMIN-PRENATAL) 27-0.8 MG TABS tablet Take 1 tablet by mouth daily at 12 noon.   05/31/2019 at Unknown time    Review of Systems  Constitutional: Negative for fever.  Respiratory: Negative for shortness of breath.   Gastrointestinal: Negative for abdominal pain.  Genitourinary: Positive for pelvic pain. Negative for dysuria, vaginal bleeding, vaginal discharge and vaginal pain.  Musculoskeletal: Positive for back  pain.  Neurological: Negative for dizziness and headaches.  All other systems reviewed and are negative.  Physical Exam   Blood pressure 117/70, pulse 99, temperature (!) 97.4 F (36.3 C), temperature source Oral, resp. rate 14, height 5\' 2"  (1.575 m), weight 89.9 kg, last menstrual period 10/20/2018, SpO2 100 %, unknown if currently breastfeeding.  Physical Exam  Nursing note and vitals reviewed. Constitutional: She is oriented to person, place, and time. She appears well-developed and well-nourished.  Cardiovascular: Normal rate and normal heart sounds.  Respiratory: Effort normal and breath sounds normal.  GI: Soft. She exhibits no distension. There is no abdominal tenderness. There is no rebound and no guarding.  Gravid  Genitourinary:    Vagina and uterus normal.     No vaginal discharge.     Genitourinary Comments: Cervix visually closed on sterile speculum exam. Confirmed with digital exam.   Neurological: She is alert and oriented to person, place, and time.  Skin: Skin is warm and dry.  Psychiatric: She has a normal mood and affect. Her behavior is normal. Judgment and thought content normal.    MAU Course/MDM  Procedures  --S/p negative FFN 05/27/2019 --Reactive tracing: baseline 130, mod variability, positive accels, no decels --Toco: irregular mild ctx q 2-10 minutes, resolving with treatments in MAU --Abnormal UA, presence of squam suggests contaminated catch. Urine culture ordered  1215 --Returned to bedside to assist with repositioning EFM --Patient endorses recurrence of low back pain, now shaking and stating she cannot tolerate being semi reclined --Cervix remains closed --Patient endorses immediate reduction in pain score with repositioning to right lateral, hot packs applied to back --Patient declines Terbutaline  1257 --Returned to bedside, patient in left lateral talking on phone to family --Toco activity significantly improved, patient continues to  endorse mid and lower back pain 6/10 --Amenable to Oxycodone, order placed, RN updated  0130 --Patient verbalizes to [place], RN that her pain score is now 0/10  Orders Placed This Encounter  Procedures  . Culture, OB Urine  . Urinalysis, Routine w reflex microscopic  . Insert peripheral IV  . Discharge patient   Meds ordered this encounter  Medications  . lactated ringers bolus 1,000 mL  . NIFEdipine (PROCARDIA) capsule 10 mg  . cyclobenzaprine (FLEXERIL) tablet 10 mg  . lactated ringers bolus 1,000 mL  . NIFEdipine (PROCARDIA) capsule 10 mg    4th and final dose  . oxyCODONE (Oxy IR/ROXICODONE) immediate release tablet 10 mg  . cyclobenzaprine (FLEXERIL) 10 MG tablet    Sig: Take 1 tablet (10 mg total) by mouth 2 (two) times daily as needed for muscle spasms.    Dispense:  20 tablet  Refill:  0    Order Specific Question:   Supervising Provider    Answer:   Lazaro Arms [[id number]]   Patient Vitals for the past 24 hrs:  BP Temp Temp src Pulse Resp SpO2 Height Weight  06/01/19 0206 (!) 110/59 -- -- 100 15 100 % -- --  06/01/19 0029 (!) 102/51 -- -- 93 -- -- -- --  05/31/19 2351 117/70 -- -- -- -- -- -- --  05/31/19 2331 115/69 -- -- -- -- -- -- --  05/31/19 2311 113/64 -- -- -- -- -- -- --  05/31/19 2253 114/68 (!) 97.4 F (36.3 C) Oral 99 14 100 % 5\' 2"  (1.575 m) 89.9 kg   Results for orders placed or performed during the hospital encounter of 05/31/19 (from the past 24 hour(s))  Urinalysis, Routine w reflex microscopic     Status: Abnormal   Collection Time: 05/31/19 10:48 PM  Result Value Ref Range   Color, Urine YELLOW YELLOW   APPearance CLEAR CLEAR   Specific Gravity, Urine 1.006 1.005 - 1.030   pH 7.0 5.0 - 8.0   Glucose, UA NEGATIVE NEGATIVE mg/dL   Hgb urine dipstick NEGATIVE NEGATIVE   Bilirubin Urine NEGATIVE NEGATIVE   Ketones, ur NEGATIVE NEGATIVE mg/dL   Protein, ur NEGATIVE NEGATIVE mg/dL   Nitrite NEGATIVE NEGATIVE   Leukocytes,Ua MODERATE (A)  NEGATIVE   RBC / HPF 0-5 0 - 5 RBC/hpf   WBC, UA 6-10 0 - 5 WBC/hpf   Bacteria, UA FEW (A) NONE SEEN   Squamous Epithelial / LPF 6-10 0 - 5   Mucus PRESENT     Assessment and Plan  --24 y.o. G2P1001 at [redacted]w[redacted]d  --Reactive tracing --S/p 3 hours continuous monitoring cervix remains 0/thick/posterior --Pain resolved with treatments administered in MAU --Discharge home in stable condition  F/U: --[facility] 06/03/2019  08/03/2019, CNM 06/01/2019, 4:42 AM

## 2019-06-01 NOTE — Discharge Instructions (Signed)

## 2019-06-25 ENCOUNTER — Inpatient Hospital Stay (HOSPITAL_COMMUNITY)
Admission: AD | Admit: 2019-06-25 | Discharge: 2019-06-25 | Disposition: A | Discharge status: Home / Self Care | Payer: BC Managed Care – PPO | Attending: Obstetrics and Gynecology | Admitting: Obstetrics and Gynecology

## 2019-06-25 ENCOUNTER — Other Ambulatory Visit: Payer: Self-pay

## 2019-06-25 ENCOUNTER — Encounter (HOSPITAL_COMMUNITY): Payer: Self-pay | Admitting: Obstetrics and Gynecology

## 2019-06-25 DIAGNOSIS — O26893 Other specified pregnancy related conditions, third trimester: Secondary | ICD-10-CM | POA: Insufficient documentation

## 2019-06-25 DIAGNOSIS — Z0371 Encounter for suspected problem with amniotic cavity and membrane ruled out: Secondary | ICD-10-CM

## 2019-06-25 DIAGNOSIS — Z3A36 36 weeks gestation of pregnancy: Secondary | ICD-10-CM | POA: Diagnosis not present

## 2019-06-25 DIAGNOSIS — O479 False labor, unspecified: Secondary | ICD-10-CM

## 2019-06-25 LAB — POCT FERN TEST: POCT Fern Test: NEGATIVE

## 2019-06-25 NOTE — Discharge Instructions (Signed)
Reasons to return to MAU at [facility]:  1.  Contractions are  5 minutes apart or less, each last 1 minute, these have been going on for 1-2 hours, and you cannot walk or talk during them 2.  You have a large gush of fluid, or a trickle of fluid that will not stop and you have to wear a pad 3.  You have bleeding that is bright red, heavier than spotting--like menstrual bleeding (spotting can be normal in early labor or after a check of your cervix) 4.  You do not feel the baby moving like he/she normally does  

## 2019-06-25 NOTE — MAU Provider Note (Signed)
First Provider Initiated Contact with Patient 06/25/19 2139       S: Ms. Nichole Curtis is a 24 y.o. G2P1001 at [redacted]w[redacted]d  who presents to MAU today complaining of leaking of fluid since 1730. She denies vaginal bleeding. She endorses contractions. She reports normal fetal movement.    O: BP (!) 107/55 (BP Location: Right Arm)   Pulse (!) 104   Temp (!) 97.5 F (36.4 C) (Oral)   Resp 18   Ht 5\' 2"  (1.575 m)   Wt 89.6 kg   LMP 10/20/2018   SpO2 97%   BMI 36.12 kg/m  GENERAL: Well-developed, well-nourished female in no acute distress.  HEAD: Normocephalic, atraumatic.  CHEST: Normal effort of breathing, regular heart rate ABDOMEN: Soft, nontender, gravid PELVIC: Normal external female genitalia. Vagina is pink and rugated. Cervix with normal contour, no lesions. Normal discharge.  Negative pooling with valsalva.   Cervical exam:  Dilation: Closed Effacement (%): Thick Cervical Position: Posterior Exam by:: L leftwich-Kirby CNM   Fetal Monitoring: Baseline: 135 Variability: moderate Accelerations: present Decelerations: none Contractions: Irregular, mild to palpation  Results for orders placed or performed during the hospital encounter of 06/25/19 (from the past 24 hour(s))  POCT fern test     Status: Abnormal   Collection Time: 06/25/19 10:06 PM  Result Value Ref Range   POCT Fern Test Negative = intact amniotic membranes      A: SIUP at [redacted]w[redacted]d  Membranes intact  P: No evidence of PROM or of active labor today.  Gave pt option of staying in MAU for recheck of cervix in 1-1.5 hours or of discharge with ROM ruled out. Pt prefers to go home now, return with s/sx of labor.  Labor precautions reviewed.  [redacted]w[redacted]d, CNM 06/25/2019 10:09 PM

## 2019-06-25 NOTE — MAU Note (Signed)
Pt reports pelvic and lower abdominal pain that started around 1730. Reports pain is same as the previous visits. Started having some leaking of fluid around 1730 as well. Did not "see" what fluid looked like before she sat on toilet. Is not wearing pad now. Pt denies vaginal bleeding. Reports good fetal movement. Cervix was closed yesterday.

## 2019-07-01 ENCOUNTER — Encounter (HOSPITAL_COMMUNITY): Payer: Self-pay | Admitting: Obstetrics and Gynecology

## 2019-07-01 ENCOUNTER — Other Ambulatory Visit: Payer: Self-pay

## 2019-07-01 ENCOUNTER — Inpatient Hospital Stay (HOSPITAL_COMMUNITY)
Admission: AD | Admit: 2019-07-01 | Discharge: 2019-07-05 | DRG: 806 | Disposition: A | Discharge status: Home / Self Care | Payer: BC Managed Care – PPO | Attending: Obstetrics & Gynecology | Admitting: Obstetrics & Gynecology

## 2019-07-01 DIAGNOSIS — B179 Acute viral hepatitis, unspecified: Secondary | ICD-10-CM | POA: Diagnosis present

## 2019-07-01 DIAGNOSIS — K760 Fatty (change of) liver, not elsewhere classified: Secondary | ICD-10-CM | POA: Diagnosis present

## 2019-07-01 DIAGNOSIS — R748 Abnormal levels of other serum enzymes: Secondary | ICD-10-CM

## 2019-07-01 DIAGNOSIS — R7401 Elevation of levels of liver transaminase levels: Secondary | ICD-10-CM | POA: Diagnosis present

## 2019-07-01 DIAGNOSIS — O9842 Viral hepatitis complicating childbirth: Principal | ICD-10-CM | POA: Diagnosis present

## 2019-07-01 DIAGNOSIS — A084 Viral intestinal infection, unspecified: Secondary | ICD-10-CM | POA: Diagnosis present

## 2019-07-01 DIAGNOSIS — O479 False labor, unspecified: Secondary | ICD-10-CM

## 2019-07-01 DIAGNOSIS — R5383 Other fatigue: Secondary | ICD-10-CM

## 2019-07-01 DIAGNOSIS — Z880 Allergy status to penicillin: Secondary | ICD-10-CM

## 2019-07-01 DIAGNOSIS — O9963 Diseases of the digestive system complicating the puerperium: Secondary | ICD-10-CM | POA: Diagnosis present

## 2019-07-01 DIAGNOSIS — Z87891 Personal history of nicotine dependence: Secondary | ICD-10-CM

## 2019-07-01 DIAGNOSIS — T50905A Adverse effect of unspecified drugs, medicaments and biological substances, initial encounter: Secondary | ICD-10-CM

## 2019-07-01 DIAGNOSIS — Z20822 Contact with and (suspected) exposure to covid-19: Secondary | ICD-10-CM | POA: Diagnosis present

## 2019-07-01 DIAGNOSIS — O2662 Liver and biliary tract disorders in childbirth: Secondary | ICD-10-CM | POA: Diagnosis present

## 2019-07-01 DIAGNOSIS — Z3A37 37 weeks gestation of pregnancy: Secondary | ICD-10-CM

## 2019-07-01 DIAGNOSIS — O864 Pyrexia of unknown origin following delivery: Secondary | ICD-10-CM | POA: Diagnosis not present

## 2019-07-01 DIAGNOSIS — Z3493 Encounter for supervision of normal pregnancy, unspecified, third trimester: Secondary | ICD-10-CM

## 2019-07-01 DIAGNOSIS — O4700 False labor before 37 completed weeks of gestation, unspecified trimester: Secondary | ICD-10-CM

## 2019-07-01 LAB — CBC
HCT: 38.9 % (ref 36.0–46.0)
Hemoglobin: 12.3 g/dL (ref 12.0–15.0)
MCH: 25.5 pg — ABNORMAL LOW (ref 26.0–34.0)
MCHC: 31.6 g/dL (ref 30.0–36.0)
MCV: 80.5 fL (ref 80.0–100.0)
Platelets: 245 10*3/uL (ref 150–400)
RBC: 4.83 MIL/uL (ref 3.87–5.11)
RDW: 13.6 % (ref 11.5–15.5)
WBC: 6.5 10*3/uL (ref 4.0–10.5)
nRBC: 0 % (ref 0.0–0.2)

## 2019-07-01 LAB — URINALYSIS, ROUTINE W REFLEX MICROSCOPIC
Bilirubin Urine: NEGATIVE
Glucose, UA: NEGATIVE mg/dL
Hgb urine dipstick: NEGATIVE
Ketones, ur: NEGATIVE mg/dL
Nitrite: NEGATIVE
Protein, ur: 30 mg/dL — AB
Specific Gravity, Urine: 1.025 (ref 1.005–1.030)
pH: 5 (ref 5.0–8.0)

## 2019-07-01 LAB — COMPREHENSIVE METABOLIC PANEL
ALT: 223 U/L — ABNORMAL HIGH (ref 0–44)
AST: 159 U/L — ABNORMAL HIGH (ref 15–41)
Albumin: 2.6 g/dL — ABNORMAL LOW (ref 3.5–5.0)
Alkaline Phosphatase: 262 U/L — ABNORMAL HIGH (ref 38–126)
Anion gap: 11 (ref 5–15)
BUN: 6 mg/dL (ref 6–20)
CO2: 20 mmol/L — ABNORMAL LOW (ref 22–32)
Calcium: 8.5 mg/dL — ABNORMAL LOW (ref 8.9–10.3)
Chloride: 103 mmol/L (ref 98–111)
Creatinine, Ser: 0.5 mg/dL (ref 0.44–1.00)
GFR calc Af Amer: >60 mL/min (ref >60)
GFR calc non Af Amer: >60 mL/min (ref >60)
Glucose, Bld: 96 mg/dL (ref 70–99)
Potassium: 3.8 mmol/L (ref 3.5–5.1)
Sodium: 134 mmol/L — ABNORMAL LOW (ref 135–145)
Total Bilirubin: 0.5 mg/dL (ref 0.3–1.2)
Total Protein: 6.6 g/dL (ref 6.5–8.1)

## 2019-07-01 LAB — PROTEIN / CREATININE RATIO, URINE
Creatinine, Urine: 139.97 mg/dL
Protein Creatinine Ratio: 0.18 mg/mg{Cre} — ABNORMAL HIGH (ref 0.00–0.15)
Total Protein, Urine: 25 mg/dL

## 2019-07-01 MED ORDER — LACTATED RINGERS IV BOLUS
1000.0000 mL | Freq: Once | INTRAVENOUS | Status: AC
  Administered 2019-07-01: 1000 mL via INTRAVENOUS

## 2019-07-01 MED ORDER — PRENATAL MULTIVITAMIN CH: 1.0000 | ORAL_TABLET | Freq: Every day | ORAL | Status: DC

## 2019-07-01 MED ORDER — DOCUSATE SODIUM 100 MG PO CAPS
100.0000 mg | ORAL_CAPSULE | Freq: Every day | ORAL | Status: DC
  Filled 2019-07-01: qty 1

## 2019-07-01 MED ORDER — LACTATED RINGERS IV SOLN: INTRAVENOUS | Status: DC

## 2019-07-01 MED ORDER — ONDANSETRON HCL 4 MG/2ML IJ SOLN
4.0000 mg | Freq: Four times a day (QID) | INTRAMUSCULAR | Status: DC | PRN
  Administered 2019-07-02: 15:00:00 4 mg via INTRAVENOUS
  Filled 2019-07-01: qty 2

## 2019-07-01 MED ORDER — CALCIUM CARBONATE ANTACID 500 MG PO CHEW: 2.0000 | CHEWABLE_TABLET | ORAL | Status: DC | PRN

## 2019-07-01 MED ORDER — ZOLPIDEM TARTRATE 5 MG PO TABS: 5.0000 mg | ORAL_TABLET | Freq: Every evening | ORAL | Status: DC | PRN

## 2019-07-01 MED ORDER — OXYCODONE HCL 5 MG PO TABS: 5.0000 mg | ORAL_TABLET | ORAL | Status: DC | PRN

## 2019-07-01 MED ORDER — DEXTROSE-NACL 5-0.45 % IV SOLN: INTRAVENOUS | Status: DC

## 2019-07-01 MED ORDER — ONDANSETRON HCL 4 MG/2ML IJ SOLN
4.0000 mg | Freq: Once | INTRAMUSCULAR | Status: AC
  Administered 2019-07-01: 19:00:00 4 mg via INTRAVENOUS
  Filled 2019-07-01: qty 2

## 2019-07-01 NOTE — MAU Note (Signed)
Pt presents to MAU with headache that she has had for 2 days, back pain and nausea x 3 days.Today she has also been extremely tired, falling asleep sitting up and cant hardly stay awake. Pt denies VB and LOF. +FM

## 2019-07-01 NOTE — H&P (Signed)
Nichole Curtis is a 24 y.o. female presenting for sleepiness and general feelings of unwell in addition to contractions. Reports took a 24 hour dramamine for nausea and has been sleepy ever since. For the past few days, has noted feelings of dehydration and poor PO intake. However, able to hold down everything she eats. Patient in MAU has been alert, oriented, and nontoxic appearing while being evaluate by MAU staff.  Labs were requested and she was incidentally found to have elevated LFTs. Please see MAU note for further details. Since being evaluated by MAU provider, pt reports feeling much better. HA completley resolved. No nausea. Has not had any emesis.  NO abdominal pain since contractions went away a few hours ago. Sleepiness improved.  Hx prior uncomplicated pregnancy ending SVD.    OB History    Gravida  2   Para  1   Term  1   Preterm      AB      Living  1     SAB      TAB      Ectopic      Multiple  0   Live Births  1          Past Medical History:  Diagnosis Date  . Anxiety    when her dad died  . Asthma   . Eczema   . Headache   . Infection    UTI   Past Surgical History:  Procedure Laterality Date  . NO PAST SURGERIES     Family History: family history includes Heart disease in her father and mother; Hypertension in her mother. Social History:  reports that she has quit smoking. Her smoking use included e-cigarettes. She has never used smokeless tobacco. She reports previous alcohol use. She reports that she does not use drugs.     Maternal Diabetes: No Genetic Screening: Normal Maternal Ultrasounds/Referrals: Normal Fetal Ultrasounds or other Referrals:  None Maternal Substance Abuse:  No Significant Maternal Medications:  None Significant Maternal Lab Results:  None Other Comments:  None  Review of Systems History Dilation: Closed Effacement (%): Thick Exam by:: Lattie Haw, CNM Blood pressure (!) 107/57, pulse (!) 116, temperature 98.2 F  (36.8 C), temperature source Oral, resp. rate 18, last menstrual period 10/20/2018, SpO2 98 %, unknown if currently breastfeeding. Exam Physical Exam  NAD, A&O, conversive, very pleasant HR low 100s NWOB Abd soft, nondistended, gravid SVE deferred - closed by MAU provider  Prenatal labs: ABO, Rh:   Antibody:   Rubella:   RPR:    HBsAg:    HIV:    GBS:    negative  Assessment/Plan: 24 yo G2P1001 @ 37.1 with incidentally found transaminitis of unclear etiology.  Differential diagnosis is broad and is possibly explained by patient's report of consistent tylenol use coupled with mild dehydration. Patient appears clinically well and nontoxic. However, given such high elevation, recommend admission for observation and possible work up if persistent.   # Transaminitis:  AST 159 and ALT 223 Differential of possibilities include but not limited to:  - medication related (taking tylenol every few hours for occasional HA)  - dehydration  - acute hepatitis (however no recent RFs, fever, etc) - will send hepatitis panel if persistently elevated.   - Pre-eclampsia/HELLP syndrome: occasional HA relieved with pain medications but NO hypertension at all. UPC WNL. PLTs wnl, hgb WNL. At this time it is unlikely but not excluded.   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   - Acute fatty liver of pregnancy: perhaps the most worriesome of the possible etiologies. Meets only 2 (abdominal pain, transaminitis) of typical noted at least 6 of 14 criteria. Furthermore, no hypoglycemia. While this is hopefully not the cause, it is NOT completely excluded. Will add on coags with labs and ctm closely   Plan:  - Avoid hepato-toxic mediations, defer tylenol for now - Consider liver imaging if persistently elevated - Consider MFM vs GI consultation in the AM if transaminitis persists/worsens.   # Mild tachycardia felt to be s/s anxiety - LR @  75 cc/hr - encouraged fluids - pt tolerating PO nicely now  # mild hyponatremia: repeat in AM  # contractions: resolved with IVF. CTM closely. SVE closed.   # FWB: reassuring - reactive, category 1 tracing.  NST TID for now. Consider CEFM if worsening complaints/contractions  # Dispo: AP   Ranae Pila 07/01/2019, 10:17 PM

## 2019-07-01 NOTE — MAU Provider Note (Signed)
Chief Complaint:  Headache, Back Pain, Nausea, and drowsy   None     HPI: Nichole Curtis is a 24 y.o. G2P1001 at [redacted]w[redacted]d who presents to maternity admissions reporting headache x 2 days, back pain, nausea x 3 days and extreme fatigue today.  She reports frontal intermittent mild h/a resolved by Tylenol, with 1000 mg taken x 1 yesterday and 1000 mg x 2 doses 4 hours apart yesterday.  Her back pain is intermittent, 4-5 times per hour and does not radiate.  She reports nausea x 3 days and took Dramamine low drowsy (with meclizine) last night at 6 pm. She took this early in the pregnancy but has not taken in a several weeks.  Today, she reports she is falling asleep while sitting up, while talking with her husband, and this is unusual. She feels foggy headed and fatigued.  She reports sleeping well, 5-7 hours consecutively every night, 8 hours total. There are no other symptoms. She has not tried any treatments.    HPI  Past Medical History: Past Medical History:  Diagnosis Date  . Anxiety    when her dad died  . Asthma   . Eczema   . Headache   . Infection    UTI    Past obstetric history: OB History  Gravida Para Term Preterm AB Living  2 1 1     1   SAB TAB Ectopic Multiple Live Births        0 1    # Outcome Date GA Lbr Len/2nd Weight Sex Delivery Anes PTL Lv  2 Current           1 Term 09/18/15 [redacted]w[redacted]d 11:30 / 02:24 3190 g F Vag-Spont EPI  LIV    Past Surgical History: Past Surgical History:  Procedure Laterality Date  . NO PAST SURGERIES      Family History: Family History  Problem Relation Age of Onset  . Heart disease Mother   . Hypertension Mother   . Heart disease Father     Social History: Social History   Tobacco Use  . Smoking status: Former Smoker    Types: E-cigarettes  . Smokeless tobacco: Never Used  . Tobacco comment: quit with preg 11-2018  Substance Use Topics  . Alcohol use: Not Currently    Comment: social   . Drug use: No    Allergies:   Allergies  Allergen Reactions  . Other Itching and Anaphylaxis    And [place]  . Penicillins Hives and Rash    Has patient had a PCN reaction causing immediate rash, facial/tongue/throat swelling, SOB or lightheadedness with hypotension: No Has patient had a PCN reaction causing severe rash involving mucus membranes or skin necrosis: No Has patient had a PCN reaction that required hospitalization No Has patient had a PCN reaction occurring within the last 10 years: Yes If all of the above answers are "NO", then may proceed with Cephalosporin use.  Has patient had a PCN reaction causing immediate rash, facial/tongue/throat swelling, SOB or lightheadedness with hypotension: No Has patient had a PCN reaction causing severe rash involving mucus membranes or skin necrosis: No Has patient had a PCN reaction that required hospitalization No Has patient had a PCN reaction occurring within the last 10 years: Yes If all of the above answers are "NO", then may proceed with Cephalosporin use.  12-2018 Peanut-Containing Drug Products     Meds:  Medications Prior to Admission  Medication Sig Dispense Refill Last Dose  . acetaminophen (  TYLENOL) 325 MG tablet Take 1,000 mg by mouth every 6 (six) hours as needed.   07/01/2019 at Unknown time  . albuterol (PROVENTIL,VENTOLIN) 90 MCG/ACT inhaler Inhale 1-4 puffs into the lungs every 6 (six) hours as needed. Shortness of breath and wheezing     07/01/2019 at Unknown time  . cyclobenzaprine (FLEXERIL) 10 MG tablet Take 1 tablet (10 mg total) by mouth 2 (two) times daily as needed for muscle spasms. 20 tablet 0 Past Month at Unknown time  . montelukast (SINGULAIR) 10 MG tablet Take 10 mg by mouth at bedtime.   06/30/2019 at Unknown time  . Prenatal Vit-Fe Fumarate-FA (MULTIVITAMIN-PRENATAL) 27-0.8 MG TABS tablet Take 1 tablet by mouth daily at 12 noon.   06/30/2019 at Unknown time  . beclomethasone (QVAR) 80 MCG/ACT inhaler Inhale 1 puff into the lungs 2 (two) times daily.     More than a month at Unknown time    ROS:  Review of Systems  Constitutional: Positive for fatigue. Negative for chills and fever.  Eyes: Negative for visual disturbance.  Respiratory: Negative for shortness of breath.   Cardiovascular: Negative for chest pain.  Gastrointestinal: Positive for nausea. Negative for abdominal pain and vomiting.  Genitourinary: Negative for difficulty urinating, dysuria, flank pain, pelvic pain, vaginal bleeding, vaginal discharge and vaginal pain.  Musculoskeletal: Positive for back pain.  Neurological: Positive for headaches. Negative for dizziness.  Psychiatric/Behavioral: Negative.      I have reviewed patient's Past Medical Hx, Surgical Hx, Family Hx, Social Hx, medications and allergies.   Physical Exam   Patient Vitals for the past 24 hrs:  BP Temp Temp src Pulse Resp SpO2  07/01/19 2216 (!) 97/46 -- -- (!) 107 -- --  07/01/19 2116 (!) 107/57 -- -- (!) 116 -- --  07/01/19 2101 115/67 -- -- (!) 122 -- --  07/01/19 2046 115/73 -- -- (!) 112 -- --  07/01/19 2032 112/62 -- -- (!) 131 -- --  07/01/19 2000 119/67 -- -- (!) 120 -- 98 %  07/01/19 1743 122/72 98.2 F (36.8 C) Oral (!) 105 18 99 %   Constitutional: Well-developed, well-nourished female in no acute distress.  HEART: normal rate, heart sounds, regular rhythm RESP: normal effort, lung sounds clear and equal bilaterally GI: Abd soft, non-tender, gravid appropriate for gestational age.  MS: Extremities nontender, no edema, normal ROM Neurological - alert, oriented, normal speech, no focal findings or movement disorder noted, screening mental status exam normal, cranial nerves II through XII intact, DTR's normal and symmetric, motor and sensory grossly normal bilaterally, normal muscle tone, no tremors, strength 5/5 GU: Neg CVAT.   Dilation: Closed Effacement (%): Thick Cervical Position: Posterior Exam by:: Lattie Haw, CNM  FHT:  Upon arrival, FHR baseline 175-180 with moderate  variability and accelerations but with LR x 1000 ml, baseline down to 150, moderate variability, accelerations present, no decelerations Contractions: irregular, mild   Labs: Results for orders placed or performed during the hospital encounter of 07/01/19 (from the past 24 hour(s))  Urinalysis, Routine w reflex microscopic     Status: Abnormal   Collection Time: 07/01/19  5:23 PM  Result Value Ref Range   Color, Urine AMBER (A) YELLOW   APPearance HAZY (A) CLEAR   Specific Gravity, Urine 1.025 1.005 - 1.030   pH 5.0 5.0 - 8.0   Glucose, UA NEGATIVE NEGATIVE mg/dL   Hgb urine dipstick NEGATIVE NEGATIVE   Bilirubin Urine NEGATIVE NEGATIVE   Ketones, ur NEGATIVE NEGATIVE mg/dL   Protein,  ur 30 (A) NEGATIVE mg/dL   Nitrite NEGATIVE NEGATIVE   Leukocytes,Ua SMALL (A) NEGATIVE   RBC / HPF 0-5 0 - 5 RBC/hpf   WBC, UA 0-5 0 - 5 WBC/hpf   Bacteria, UA FEW (A) NONE SEEN   Squamous Epithelial / LPF 0-5 0 - 5   Mucus PRESENT   Protein / creatinine ratio, urine     Status: Abnormal   Collection Time: 07/01/19  5:52 PM  Result Value Ref Range   Creatinine, Urine 139.97 mg/dL   Total Protein, Urine 25 mg/dL   Protein Creatinine Ratio 0.18 (H) 0.00 - 0.15 mg/mg[Cre]  CBC     Status: Abnormal   Collection Time: 07/01/19  6:46 PM  Result Value Ref Range   WBC 6.5 4.0 - 10.5 K/uL   RBC 4.83 3.87 - 5.11 MIL/uL   Hemoglobin 12.3 12.0 - 15.0 g/dL   HCT 65.0 35.4 - 65.6 %   MCV 80.5 80.0 - 100.0 fL   MCH 25.5 (L) 26.0 - 34.0 pg   MCHC 31.6 30.0 - 36.0 g/dL   RDW 81.2 75.1 - 70.0 %   Platelets 245 150 - 400 K/uL   nRBC 0.0 0.0 - 0.2 %  Comprehensive metabolic panel     Status: Abnormal   Collection Time: 07/01/19  6:46 PM  Result Value Ref Range   Sodium 134 (L) 135 - 145 mmol/L   Potassium 3.8 3.5 - 5.1 mmol/L   Chloride 103 98 - 111 mmol/L   CO2 20 (L) 22 - 32 mmol/L   Glucose, Bld 96 70 - 99 mg/dL   BUN 6 6 - 20 mg/dL   Creatinine, Ser 1.74 0.44 - 1.00 mg/dL   Calcium 8.5 (L) 8.9 -  10.3 mg/dL   Total Protein 6.6 6.5 - 8.1 g/dL   Albumin 2.6 (L) 3.5 - 5.0 g/dL   AST 944 (H) 15 - 41 U/L   ALT 223 (H) 0 - 44 U/L   Alkaline Phosphatase 262 (H) 38 - 126 U/L   Total Bilirubin 0.5 0.3 - 1.2 mg/dL   GFR calc non Af Amer >60 >60 mL/min   GFR calc Af Amer >60 >60 mL/min   Anion gap 11 5 - 15      Imaging:  No results found.  MAU Course/MDM: Orders Placed This Encounter  Procedures  . SARS CORONAVIRUS 2 (TAT 6-24 HRS) Nasopharyngeal Nasopharyngeal Swab  . Urinalysis, Routine w reflex microscopic  . CBC  . Comprehensive metabolic panel  . Protein / creatinine ratio, urine    Meds ordered this encounter  Medications  . lactated ringers bolus 1,000 mL  . ondansetron (ZOFRAN) injection 4 mg  . lactated ringers infusion     NST reviewed and reactive after IV fluids Labs reviewed and all wnl except elevated liver enzymes Initially pt reported taking Tylenol Q 4 hours but upon further questioning, reports that she thought it ws 1000 mg Q 4 hours as needed but she is only taking 1-2 doses per day No other indication for elevated liver enzymes apparent, with normal BP and normal preeclampsia labs Consult Dr Shawnie Pons with presentation, exam findings and test results.  Pt needs follow up for liver enzymes, so called Dr Elon Spanner to discuss Admit pt to antepartum for BP monitoring and repeat labs in the morning, consider RUQ ultrasound for liver evaluation   Assessment: 1. Elevated liver enzymes   2. Pregnancy with 37 weeks completed gestation   3. Other fatigue   4.  Drug side effects, initial encounter     Plan: Admit to [facility] Unit per Dr Suzan Garibaldi Certified Nurse-Midwife 07/01/2019 10:33 PM

## 2019-07-01 NOTE — MAU Note (Signed)
Covid swab obtained. Pt held nurse's hand with swab and would not let nurse advance swab very far into nostril.

## 2019-07-02 ENCOUNTER — Other Ambulatory Visit: Payer: Self-pay

## 2019-07-02 ENCOUNTER — Encounter (HOSPITAL_COMMUNITY): Payer: Self-pay | Admitting: Obstetrics & Gynecology

## 2019-07-02 ENCOUNTER — Inpatient Hospital Stay (HOSPITAL_COMMUNITY): Payer: BC Managed Care – PPO

## 2019-07-02 ENCOUNTER — Observation Stay (HOSPITAL_COMMUNITY): Payer: BC Managed Care – PPO

## 2019-07-02 DIAGNOSIS — O9842 Viral hepatitis complicating childbirth: Secondary | ICD-10-CM | POA: Diagnosis present

## 2019-07-02 DIAGNOSIS — O99891 Other specified diseases and conditions complicating pregnancy: Secondary | ICD-10-CM | POA: Diagnosis not present

## 2019-07-02 DIAGNOSIS — Z3A37 37 weeks gestation of pregnancy: Secondary | ICD-10-CM

## 2019-07-02 DIAGNOSIS — O9963 Diseases of the digestive system complicating the puerperium: Secondary | ICD-10-CM | POA: Diagnosis present

## 2019-07-02 DIAGNOSIS — Z20822 Contact with and (suspected) exposure to covid-19: Secondary | ICD-10-CM | POA: Diagnosis present

## 2019-07-02 DIAGNOSIS — B179 Acute viral hepatitis, unspecified: Secondary | ICD-10-CM | POA: Diagnosis present

## 2019-07-02 DIAGNOSIS — Z880 Allergy status to penicillin: Secondary | ICD-10-CM | POA: Diagnosis not present

## 2019-07-02 DIAGNOSIS — R7401 Elevation of levels of liver transaminase levels: Secondary | ICD-10-CM | POA: Diagnosis not present

## 2019-07-02 DIAGNOSIS — O864 Pyrexia of unknown origin following delivery: Secondary | ICD-10-CM | POA: Diagnosis not present

## 2019-07-02 DIAGNOSIS — Z87891 Personal history of nicotine dependence: Secondary | ICD-10-CM | POA: Diagnosis not present

## 2019-07-02 DIAGNOSIS — O2662 Liver and biliary tract disorders in childbirth: Secondary | ICD-10-CM | POA: Diagnosis present

## 2019-07-02 DIAGNOSIS — Z3493 Encounter for supervision of normal pregnancy, unspecified, third trimester: Secondary | ICD-10-CM

## 2019-07-02 DIAGNOSIS — K760 Fatty (change of) liver, not elsewhere classified: Secondary | ICD-10-CM | POA: Diagnosis present

## 2019-07-02 DIAGNOSIS — A084 Viral intestinal infection, unspecified: Secondary | ICD-10-CM | POA: Diagnosis present

## 2019-07-02 LAB — COMPREHENSIVE METABOLIC PANEL
ALT: 242 U/L — ABNORMAL HIGH (ref 0–44)
ALT: 252 U/L — ABNORMAL HIGH (ref 0–44)
AST: 191 U/L — ABNORMAL HIGH (ref 15–41)
AST: 187 U/L — ABNORMAL HIGH (ref 15–41)
Albumin: 2.1 g/dL — ABNORMAL LOW (ref 3.5–5.0)
Albumin: 2.2 g/dL — ABNORMAL LOW (ref 3.5–5.0)
Alkaline Phosphatase: 236 U/L — ABNORMAL HIGH (ref 38–126)
Alkaline Phosphatase: 235 U/L — ABNORMAL HIGH (ref 38–126)
Anion gap: 10 (ref 5–15)
Anion gap: 10 (ref 5–15)
BUN: <5 mg/dL — ABNORMAL LOW (ref 6–20)
BUN: <5 mg/dL — ABNORMAL LOW (ref 6–20)
CO2: 23 mmol/L (ref 22–32)
CO2: 24 mmol/L (ref 22–32)
Calcium: 8.4 mg/dL — ABNORMAL LOW (ref 8.9–10.3)
Calcium: 8.4 mg/dL — ABNORMAL LOW (ref 8.9–10.3)
Chloride: 104 mmol/L (ref 98–111)
Chloride: 103 mmol/L (ref 98–111)
Creatinine, Ser: 0.57 mg/dL (ref 0.44–1.00)
Creatinine, Ser: 0.56 mg/dL (ref 0.44–1.00)
GFR calc Af Amer: >60 mL/min (ref >60)
GFR calc Af Amer: >60 mL/min (ref >60)
GFR calc non Af Amer: >60 mL/min (ref >60)
GFR calc non Af Amer: >60 mL/min (ref >60)
Glucose, Bld: 89 mg/dL (ref 70–99)
Glucose, Bld: 75 mg/dL (ref 70–99)
Potassium: 3.7 mmol/L (ref 3.5–5.1)
Potassium: 3.7 mmol/L (ref 3.5–5.1)
Sodium: 137 mmol/L (ref 135–145)
Sodium: 137 mmol/L (ref 135–145)
Total Bilirubin: 0.6 mg/dL (ref 0.3–1.2)
Total Bilirubin: 0.8 mg/dL (ref 0.3–1.2)
Total Protein: 5.7 g/dL — ABNORMAL LOW (ref 6.5–8.1)
Total Protein: 5.7 g/dL — ABNORMAL LOW (ref 6.5–8.1)

## 2019-07-02 LAB — SARS CORONAVIRUS 2 (TAT 6-24 HRS): SARS Coronavirus 2: NEGATIVE

## 2019-07-02 LAB — PROTIME-INR
INR: 0.9 (ref 0.8–1.2)
Prothrombin Time: 12.4 seconds (ref 11.4–15.2)

## 2019-07-02 LAB — CBC
HCT: 35.3 % — ABNORMAL LOW (ref 36.0–46.0)
Hemoglobin: 11.1 g/dL — ABNORMAL LOW (ref 12.0–15.0)
MCH: 25.4 pg — ABNORMAL LOW (ref 26.0–34.0)
MCHC: 31.4 g/dL (ref 30.0–36.0)
MCV: 80.8 fL (ref 80.0–100.0)
Platelets: 215 10*3/uL (ref 150–400)
RBC: 4.37 MIL/uL (ref 3.87–5.11)
RDW: 13.7 % (ref 11.5–15.5)
WBC: 5.6 10*3/uL (ref 4.0–10.5)
nRBC: 0 % (ref 0.0–0.2)

## 2019-07-02 LAB — HEPATITIS PANEL, ACUTE
HCV Ab: NONREACTIVE
Hep A IgM: NONREACTIVE
Hep B C IgM: NONREACTIVE
Hepatitis B Surface Ag: NONREACTIVE

## 2019-07-02 LAB — TYPE AND SCREEN
ABO/RH(D): O NEG
Antibody Screen: POSITIVE

## 2019-07-02 LAB — OB RESULTS CONSOLE GBS: GBS: NEGATIVE

## 2019-07-02 LAB — FIBRINOGEN: Fibrinogen: 639 mg/dL — ABNORMAL HIGH (ref 210–475)

## 2019-07-02 MED ORDER — TERBUTALINE SULFATE 1 MG/ML IJ SOLN: 0.2500 mg | Freq: Once | INTRAMUSCULAR | Status: DC | PRN

## 2019-07-02 MED ORDER — ONDANSETRON HCL 4 MG/2ML IJ SOLN: 4.0000 mg | Freq: Four times a day (QID) | INTRAMUSCULAR | Status: DC | PRN

## 2019-07-02 MED ORDER — ALBUTEROL SULFATE (2.5 MG/3ML) 0.083% IN NEBU: 2.5000 mg | INHALATION_SOLUTION | Freq: Four times a day (QID) | RESPIRATORY_TRACT | Status: DC | PRN

## 2019-07-02 MED ORDER — LACTATED RINGERS IV SOLN: INTRAVENOUS | Status: DC

## 2019-07-02 MED ORDER — EPHEDRINE 5 MG/ML INJ: 10.0000 mg | INTRAVENOUS | Status: DC | PRN

## 2019-07-02 MED ORDER — PHENYLEPHRINE 40 MCG/ML (10ML) SYRINGE FOR IV PUSH (FOR BLOOD PRESSURE SUPPORT): 80.0000 ug | PREFILLED_SYRINGE | INTRAVENOUS | Status: DC | PRN

## 2019-07-02 MED ORDER — ZOLPIDEM TARTRATE 5 MG PO TABS: 5.0000 mg | ORAL_TABLET | Freq: Every evening | ORAL | Status: DC | PRN

## 2019-07-02 MED ORDER — ACETAMINOPHEN 325 MG PO TABS: 650.0000 mg | ORAL_TABLET | ORAL | Status: DC | PRN

## 2019-07-02 MED ORDER — OXYCODONE-ACETAMINOPHEN 5-325 MG PO TABS: 1.0000 | ORAL_TABLET | ORAL | Status: DC | PRN

## 2019-07-02 MED ORDER — ACETAMINOPHEN 500 MG PO TABS
1000.0000 mg | ORAL_TABLET | Freq: Once | ORAL | Status: AC
  Administered 2019-07-02: 1000 mg via ORAL
  Filled 2019-07-02: qty 2

## 2019-07-02 MED ORDER — OXYCODONE-ACETAMINOPHEN 5-325 MG PO TABS: 2.0000 | ORAL_TABLET | ORAL | Status: DC | PRN

## 2019-07-02 MED ORDER — SOD CITRATE-CITRIC ACID 500-334 MG/5ML PO SOLN: 30.0000 mL | ORAL | Status: DC | PRN

## 2019-07-02 MED ORDER — MONTELUKAST SODIUM 10 MG PO TABS
10.0000 mg | ORAL_TABLET | Freq: Every day | ORAL | Status: DC
  Administered 2019-07-03: 10 mg via ORAL
  Filled 2019-07-02 (×2): qty 1

## 2019-07-02 MED ORDER — PROMETHAZINE HCL 25 MG/ML IJ SOLN
25.0000 mg | Freq: Once | INTRAMUSCULAR | Status: AC
  Administered 2019-07-02: 16:00:00 25 mg via INTRAVENOUS
  Filled 2019-07-02: qty 1

## 2019-07-02 MED ORDER — ALBUTEROL 90 MCG/ACT IN AERS: 2.0000 | INHALATION_SPRAY | Freq: Four times a day (QID) | RESPIRATORY_TRACT | Status: DC | PRN

## 2019-07-02 MED ORDER — OXYTOCIN BOLUS FROM INFUSION
500.0000 mL | Freq: Once | INTRAVENOUS | Status: AC
  Administered 2019-07-03: 500 mL via INTRAVENOUS

## 2019-07-02 MED ORDER — FENTANYL CITRATE (PF) 100 MCG/2ML IJ SOLN
50.0000 ug | INTRAMUSCULAR | Status: DC | PRN
  Administered 2019-07-02: 100 ug via INTRAVENOUS
  Filled 2019-07-02: qty 2

## 2019-07-02 MED ORDER — MISOPROSTOL 25 MCG QUARTER TABLET
25.0000 ug | ORAL_TABLET | ORAL | Status: DC | PRN
  Administered 2019-07-02 (×2): 25 ug via VAGINAL
  Filled 2019-07-02 (×3): qty 1

## 2019-07-02 MED ORDER — LACTATED RINGERS IV SOLN
500.0000 mL | INTRAVENOUS | Status: DC | PRN
  Administered 2019-07-03: 500 mL via INTRAVENOUS

## 2019-07-02 MED ORDER — FENTANYL-BUPIVACAINE-NACL 0.5-0.125-0.9 MG/250ML-% EP SOLN
12.0000 mL/h | EPIDURAL | Status: DC | PRN
  Filled 2019-07-02: qty 250

## 2019-07-02 MED ORDER — LACTATED RINGERS IV SOLN
500.0000 mL | Freq: Once | INTRAVENOUS | Status: AC
  Administered 2019-07-03: 04:00:00 500 mL via INTRAVENOUS

## 2019-07-02 MED ORDER — OXYTOCIN 40 UNITS IN NORMAL SALINE INFUSION - SIMPLE MED
2.5000 [IU]/h | INTRAVENOUS | Status: DC
  Administered 2019-07-03: 2.5 [IU]/h via INTRAVENOUS

## 2019-07-02 MED ORDER — BUDESONIDE 0.25 MG/2ML IN SUSP
0.2500 mg | Freq: Two times a day (BID) | RESPIRATORY_TRACT | Status: DC
  Filled 2019-07-02 (×3): qty 2

## 2019-07-02 MED ORDER — LIDOCAINE HCL (PF) 1 % IJ SOLN
30.0000 mL | INTRAMUSCULAR | Status: AC | PRN
  Administered 2019-07-03: 7 mL via SUBCUTANEOUS
  Administered 2019-07-03: 5 mL via SUBCUTANEOUS

## 2019-07-02 MED ORDER — DIPHENHYDRAMINE HCL 50 MG/ML IJ SOLN: 12.5000 mg | INTRAMUSCULAR | Status: DC | PRN

## 2019-07-02 NOTE — Consult Note (Addendum)
MFM Note  Nichole Curtis was seen in consultation due to elevated liver function tests.  She is a 24 year old gravida 2 para 1-0-0-1 at 37 weeks and 2 days.  She was admitted last evening with a complaint of lethargy, headache, and nausea.  On admission, her liver function tests were noted to be elevated with an AST of 159 and ALT of 223.  On repeat this morning, the AST and ALT levels increased even more to 191 and 242 respectively.  Her platelet count and kidney function tests are all within normal limits.  Her hepatitis work-up for hepatitis A, B, and C were all negative.  A P/C ratio did not indicate significant proteinuria.  She had a right upper quadrant ultrasound performed this morning that was negative for gallstones or liver pathology.  Currently the patient reports that she is feeling well.  Her headache and nausea have resolved. Her blood pressures are in the 100s over 60s range.  Her fetal status is reassuring.  The patient reports one prior full-term vaginal delivery without any complications.    Other than asthma, she denies any other significant past medical history.  She denies any history of elevated liver function tests or gallstones.  The patient and her partner were advised that the cause of her elevated liver function tests remains undetermined.  They were advised that atypical preeclampsia /HELLP syndrome has not been ruled out.    At her current gestational age (37 weeks 2 days), I would recommend delivery due to the extremely elevated liver function tests.  They were advised that hopefully her liver function tests will return to normal after delivery.  Should her liver function tests remain elevated after birth, a GI consult should be obtained to determine if any treatment or further work-up are indicated.  At the end of the consultation, both the patient and her partner stated that all their questions have been answered to their complete satisfaction.    Thank you for  referring this patient for a Maternal-Fetal Medicine consultation.

## 2019-07-02 NOTE — Progress Notes (Signed)
Dr. Langston Masker notified of patient having nausea with vomiting this morning. No new orders given. Carmelina Dane, RN

## 2019-07-02 NOTE — Progress Notes (Signed)
Nichole Curtis is a 24 y.o. G2P1001 at [redacted]w[redacted]d by ultrasound admitted for lethargy, headache, nausea and found to have transaminitis is MAU  HD2 Subjective: Patient feels better this morning; no residual lethargy.  She reports mild HA and is requesting medication.  She reports most morning she wakes up with HA.  No CP/SOB, RUQ pain, or visual disturbance.  Active FM.  Reports no nausea but did feel queasy after eating breakfast this AM.  She thinks the sweet syrup upset her stomach.    Objective: BP 116/74 (BP Location: Right Arm)   Pulse 95   Temp 98.2 F (36.8 C) (Oral)   Resp 18   LMP 10/20/2018   SpO2 98%  I/O last 3 completed shifts: In: 1159.1 [P.O.:240; I.V.:919.1] Out: 900 [Urine:900] No intake/output data recorded.   Gen: well appearing Abd: soft, NT Ext: no c/c/e  FHT:  FHR: 135 bpm, variability: moderate,  accelerations:  Present,  decelerations:  Absent UC:   none SVE:   Dilation: Closed Effacement (%): Thick Exam by:: Misty Stanley, CNM  Labs: Lab Results  Component Value Date   WBC 5.6 07/02/2019   HGB 11.1 (L) 07/02/2019   HCT 35.3 (L) 07/02/2019   MCV 80.8 07/02/2019   PLT 215 07/02/2019   AST 159->191 ALT 223->242  COVID negative  Assessment / Plan: Transaminitis in third trimester  Labs slightly worse-will check hepatitis panel MFM consult placed Patient clinically improved   Nichole Curtis 07/02/2019, 9:11 AM

## 2019-07-02 NOTE — Progress Notes (Signed)
Nichole Curtis is a 24 y.o. G2P1001 at [redacted]w[redacted]d by ultrasound admitted for induction of labor due to transaminitis.  Subjective: Patient is sleeping soundly.  No complaints.   Objective: BP (!) 96/57   Pulse 92   Temp 97.6 F (36.4 C) (Oral)   Resp 18   Ht 5\' 2"  (1.575 m)   Wt 87.5 kg   LMP 10/20/2018   SpO2 99%   BMI 35.28 kg/m  I/O last 3 completed shifts: In: 1159.1 [P.O.:240; I.V.:919.1] Out: 900 [Urine:900] Total I/O In: 545 [P.O.:320; I.V.:225] Out: 300 [Urine:300]  FHT:  FHR: 130 bpm, variability: moderate,  accelerations:  Present,  decelerations:  Absent UC:   irregular, every 2-6 minutes SVE:   Dilation: Closed Effacement (%): Thick Station: -2 Exam by:: J.Cox, RN  Labs: Lab Results  Component Value Date   WBC 5.6 07/02/2019   HGB 11.1 (L) 07/02/2019   HCT 35.3 (L) 07/02/2019   MCV 80.8 07/02/2019   PLT 215 07/02/2019    Assessment / Plan: Induction of labor due to unexplained transaminitis  Labor: Progressing normally Preeclampsia:  n/a Fetal Wellbeing:  Category I Pain Control:  Labor support without medications I/D:  n/a Anticipated MOD:  NSVD  Nichole Curtis 07/02/2019, 6:36 PM

## 2019-07-02 NOTE — Progress Notes (Signed)
Received call from MFM that given negative RUQ U/S, normal hepatitis panel, increasing LFTs and term gestational age, delivery is recommended.  Patient is counseled re: cytotec induction and all questions are answered.  Will transfer to L&D.  Mitchel Honour, DO

## 2019-07-03 ENCOUNTER — Inpatient Hospital Stay (HOSPITAL_COMMUNITY): Payer: BC Managed Care – PPO | Admitting: Anesthesiology

## 2019-07-03 ENCOUNTER — Encounter (HOSPITAL_COMMUNITY): Payer: Self-pay | Admitting: Obstetrics & Gynecology

## 2019-07-03 LAB — COMPREHENSIVE METABOLIC PANEL
ALT: 285 U/L — ABNORMAL HIGH (ref 0–44)
AST: 217 U/L — ABNORMAL HIGH (ref 15–41)
Albumin: 2 g/dL — ABNORMAL LOW (ref 3.5–5.0)
Alkaline Phosphatase: 207 U/L — ABNORMAL HIGH (ref 38–126)
Anion gap: 10 (ref 5–15)
BUN: <5 mg/dL — ABNORMAL LOW (ref 6–20)
CO2: 22 mmol/L (ref 22–32)
Calcium: 8 mg/dL — ABNORMAL LOW (ref 8.9–10.3)
Chloride: 101 mmol/L (ref 98–111)
Creatinine, Ser: 0.72 mg/dL (ref 0.44–1.00)
GFR calc Af Amer: >60 mL/min (ref >60)
GFR calc non Af Amer: >60 mL/min (ref >60)
Glucose, Bld: 129 mg/dL — ABNORMAL HIGH (ref 70–99)
Potassium: 3.4 mmol/L — ABNORMAL LOW (ref 3.5–5.1)
Sodium: 133 mmol/L — ABNORMAL LOW (ref 135–145)
Total Bilirubin: 0.4 mg/dL (ref 0.3–1.2)
Total Protein: 5.3 g/dL — ABNORMAL LOW (ref 6.5–8.1)

## 2019-07-03 LAB — CBC
HCT: 35 % — ABNORMAL LOW (ref 36.0–46.0)
Hemoglobin: 10.9 g/dL — ABNORMAL LOW (ref 12.0–15.0)
MCH: 25.2 pg — ABNORMAL LOW (ref 26.0–34.0)
MCHC: 31.1 g/dL (ref 30.0–36.0)
MCV: 81 fL (ref 80.0–100.0)
Platelets: 218 10*3/uL (ref 150–400)
RBC: 4.32 MIL/uL (ref 3.87–5.11)
RDW: 13.8 % (ref 11.5–15.5)
WBC: 8.1 10*3/uL (ref 4.0–10.5)
nRBC: 0 % (ref 0.0–0.2)

## 2019-07-03 LAB — RESPIRATORY PANEL BY RT PCR (FLU A&B, COVID)
Influenza A by PCR: NEGATIVE
Influenza B by PCR: NEGATIVE
SARS Coronavirus 2 by RT PCR: NEGATIVE

## 2019-07-03 LAB — RPR: RPR Ser Ql: NONREACTIVE

## 2019-07-03 MED ORDER — OXYTOCIN 40 UNITS IN NORMAL SALINE INFUSION - SIMPLE MED
1.0000 m[IU]/min | INTRAVENOUS | Status: DC
  Administered 2019-07-03: 2 m[IU]/min via INTRAVENOUS
  Filled 2019-07-03: qty 1000

## 2019-07-03 MED ORDER — ACETAMINOPHEN 325 MG PO TABS
650.0000 mg | ORAL_TABLET | ORAL | Status: DC | PRN
  Administered 2019-07-04: 650 mg via ORAL
  Filled 2019-07-03 (×2): qty 2

## 2019-07-03 MED ORDER — DIBUCAINE (PERIANAL) 1 % EX OINT: 1.0000 "application " | TOPICAL_OINTMENT | CUTANEOUS | Status: DC | PRN

## 2019-07-03 MED ORDER — TERBUTALINE SULFATE 1 MG/ML IJ SOLN: 0.2500 mg | Freq: Once | INTRAMUSCULAR | Status: DC | PRN

## 2019-07-03 MED ORDER — ZOLPIDEM TARTRATE 5 MG PO TABS: 5.0000 mg | ORAL_TABLET | Freq: Every evening | ORAL | Status: DC | PRN

## 2019-07-03 MED ORDER — TETANUS-DIPHTH-ACELL PERTUSSIS 5-2.5-18.5 LF-MCG/0.5 IM SUSP: 0.5000 mL | Freq: Once | INTRAMUSCULAR | Status: DC

## 2019-07-03 MED ORDER — GENTAMICIN SULFATE 40 MG/ML IJ SOLN
5.0000 mg/kg | Freq: Every day | INTRAVENOUS | Status: DC
  Administered 2019-07-03: 13:00:00 330 mg via INTRAVENOUS
  Filled 2019-07-03 (×2): qty 8.25

## 2019-07-03 MED ORDER — OXYCODONE-ACETAMINOPHEN 5-325 MG PO TABS: 1.0000 | ORAL_TABLET | ORAL | Status: DC | PRN

## 2019-07-03 MED ORDER — SODIUM CHLORIDE (PF) 0.9 % IJ SOLN
INTRAMUSCULAR | Status: DC | PRN
  Administered 2019-07-03: 12 mL/h via EPIDURAL

## 2019-07-03 MED ORDER — OXYCODONE-ACETAMINOPHEN 5-325 MG PO TABS: 2.0000 | ORAL_TABLET | ORAL | Status: DC | PRN

## 2019-07-03 MED ORDER — GENTAMICIN SULFATE 40 MG/ML IJ SOLN: 1.0000 mg/kg | Freq: Three times a day (TID) | INTRAVENOUS | Status: DC

## 2019-07-03 MED ORDER — PRENATAL MULTIVITAMIN CH
1.0000 | ORAL_TABLET | Freq: Every day | ORAL | Status: DC
  Administered 2019-07-04 – 2019-07-05 (×2): 1 via ORAL
  Filled 2019-07-03 (×2): qty 1

## 2019-07-03 MED ORDER — LIDOCAINE HCL (PF) 1 % IJ SOLN
INTRAMUSCULAR | Status: AC
  Administered 2019-07-03: 06:00:00 30 mL
  Filled 2019-07-03: qty 30

## 2019-07-03 MED ORDER — ONDANSETRON HCL 4 MG/2ML IJ SOLN: 4.0000 mg | INTRAMUSCULAR | Status: DC | PRN

## 2019-07-03 MED ORDER — RHO D IMMUNE GLOBULIN 1500 UNIT/2ML IJ SOSY
300.0000 ug | PREFILLED_SYRINGE | Freq: Once | INTRAMUSCULAR | Status: AC
  Administered 2019-07-03: 15:00:00 300 ug via INTRAVENOUS
  Filled 2019-07-03: qty 2

## 2019-07-03 MED ORDER — CLINDAMYCIN PHOSPHATE 900 MG/50ML IV SOLN
900.0000 mg | Freq: Three times a day (TID) | INTRAVENOUS | Status: DC
  Administered 2019-07-03 – 2019-07-04 (×3): 900 mg via INTRAVENOUS
  Filled 2019-07-03 (×6): qty 50

## 2019-07-03 MED ORDER — IBUPROFEN 600 MG PO TABS
600.0000 mg | ORAL_TABLET | Freq: Four times a day (QID) | ORAL | Status: DC
  Administered 2019-07-03 – 2019-07-05 (×9): 600 mg via ORAL
  Filled 2019-07-03 (×9): qty 1

## 2019-07-03 MED ORDER — SENNOSIDES-DOCUSATE SODIUM 8.6-50 MG PO TABS
2.0000 | ORAL_TABLET | ORAL | Status: DC
  Filled 2019-07-03 (×2): qty 2

## 2019-07-03 MED ORDER — SIMETHICONE 80 MG PO CHEW: 80.0000 mg | CHEWABLE_TABLET | ORAL | Status: DC | PRN

## 2019-07-03 MED ORDER — WITCH HAZEL-GLYCERIN EX PADS: 1.0000 "application " | MEDICATED_PAD | CUTANEOUS | Status: DC | PRN

## 2019-07-03 MED ORDER — ONDANSETRON HCL 4 MG PO TABS: 4.0000 mg | ORAL_TABLET | ORAL | Status: DC | PRN

## 2019-07-03 MED ORDER — BENZOCAINE-MENTHOL 20-0.5 % EX AERO
1.0000 "application " | INHALATION_SPRAY | CUTANEOUS | Status: DC | PRN
  Administered 2019-07-03: 1 via TOPICAL
  Filled 2019-07-03: qty 56

## 2019-07-03 MED ORDER — DIPHENHYDRAMINE HCL 25 MG PO CAPS: 25.0000 mg | ORAL_CAPSULE | Freq: Four times a day (QID) | ORAL | Status: DC | PRN

## 2019-07-03 MED ORDER — COCONUT OIL OIL
1.0000 "application " | TOPICAL_OIL | Status: DC | PRN
  Administered 2019-07-05: 1 via TOPICAL

## 2019-07-03 NOTE — Anesthesia Preprocedure Evaluation (Signed)
Anesthesia Evaluation  Patient identified by MRN, date of birth, ID band Patient awake    Reviewed: Allergy & Precautions, NPO status , Patient's Chart, lab work & pertinent test results  History of Anesthesia Complications Negative for: history of anesthetic complications  Airway Mallampati: II  TM Distance: >3 FB Neck ROM: Full    Dental no notable dental hx. (+) Teeth Intact   Pulmonary asthma , former smoker,    Pulmonary exam normal breath sounds clear to auscultation- rhonchi       Cardiovascular negative cardio ROS Normal cardiovascular exam Rhythm:Regular Rate:Normal     Neuro/Psych Anxiety    GI/Hepatic negative GI ROS, Neg liver ROS,   Endo/Other  negative endocrine ROS  Renal/GU negative Renal ROS     Musculoskeletal   Abdominal (+) + obese,   Peds  Hematology negative hematology ROS (+)   Anesthesia Other Findings   Reproductive/Obstetrics (+) Pregnancy                             Anesthesia Physical  Anesthesia Plan  ASA: II  Anesthesia Plan: Epidural   Post-op Pain Management:    Induction:   PONV Risk Score and Plan:   Airway Management Planned:   Additional Equipment:   Intra-op Plan:   Post-operative Plan:   Informed Consent: I have reviewed the patients History and Physical, chart, labs and discussed the procedure including the risks, benefits and alternatives for the proposed anesthesia with the patient or authorized representative who has indicated his/her understanding and acceptance.       Plan Discussed with: Anesthesiologist  Anesthesia Plan Comments:         Anesthesia Quick Evaluation

## 2019-07-03 NOTE — Anesthesia Procedure Notes (Signed)
Epidural Patient location during procedure: OB Start time: 07/03/2019 1:03 AM End time: 07/03/2019 1:07 AM  Staffing Anesthesiologist: Leilani Able, MD  Preanesthetic Checklist Completed: patient identified, IV checked, site marked, risks and benefits discussed, surgical consent, monitors and equipment checked, pre-op evaluation and timeout performed  Epidural Patient position: sitting Prep: DuraPrep and site prepped and draped Patient monitoring: continuous pulse ox and blood pressure Approach: midline Location: L3-L4 Injection technique: LOR air  Needle:  Needle type: Tuohy  Needle gauge: 17 G Needle length: 9 cm and 9 Needle insertion depth: 7 cm Catheter type: closed end flexible Catheter size: 19 Gauge Catheter at skin depth: 12 cm Test dose: negative and Other  Assessment Events: blood not aspirated, injection not painful, no injection resistance, no paresthesia and negative IV test  Additional Notes Reason for block:procedure for pain

## 2019-07-03 NOTE — Progress Notes (Signed)
I received a call from patient's nurse around 10 AM that patient spike fever of 103.  She reported no symptoms other than diarrhea which started after admission for nausea and headache.  Patient has had serial labs to follows transaminitis but has never had elevated WBC; last was 8.1.  Negative hepatitis panel. Negative RUQ u/s.  Repeat COVID and flu both negative.  Newborn has been afebrile.  I called to see if placenta could be sent for pathology but it had already been discarded.  Blood cx x 2 and urine cx were collected prior to starting Gent/Clinda to cover for possible endometritis.  On exam, patient has no new complaints and actually reports feeling better than she has at any time during this admission.  No HA, no current nausea or vomiting.  Denies CP/SOB/Cough.  Again, denies sick contacts recently.  Vitals:   07/03/19 1130 07/03/19 1409  BP:  (!) 106/59  Pulse:  91  Resp:  20  Temp: 99.7 F (37.6 C) 98.1 F (36.7 C)  SpO2:       Gen: A&O x 3 Abd: soft, NT; no fundal tenderness Pulm: CTAB CV: RRR Pelvic: appropriate and scant lochia Breast: no erythema or streaking Extremities: no c/c/e in bilateral LE  Postpartum fever (PPD1) -I suspect fever is associated with admission diagnosis and suspicion for viral gastroenteritis, however, will err on the side of caution and continue Gent/Clinda -F/U blood and urine cx -Closely monitor vital signs and symptoms  Mitchel Honour, DO

## 2019-07-03 NOTE — Progress Notes (Addendum)
Nichole Curtis is a 24 y.o. G2P1001 at [redacted]w[redacted]d by ultrasound admitted for induction of labor due to transaminitis.  Subjective: Comfortable with epidural  Objective: BP (!) 102/50   Pulse (!) 106   Temp 98.2 F (36.8 C) (Oral)   Resp 15   Ht 5\' 2"  (1.575 m)   Wt 87.5 kg   LMP 10/20/2018   SpO2 98%   BMI 35.28 kg/m  I/O last 3 completed shifts: In: 1704 [P.O.:560; I.V.:1144] Out: 1200 [Urine:1200] Total I/O In: -  Out: 300 [Urine:300]  FHT:  FHR: 155 bpm, variability: moderate,  accelerations:  Abscent,  decelerations:  Present variables UC:   regular, every 2 minutes SVE:   Dilation: 6 Effacement (%): 70 Station: -2 Exam by:: [email or web address] RN  AROM, clear fluid IUPC placed  Labs: Lab Results  Component Value Date   WBC 5.6 07/02/2019   HGB 11.1 (L) 07/02/2019   HCT 35.3 (L) 07/02/2019   MCV 80.8 07/02/2019   PLT 215 07/02/2019    Assessment / Plan: Induction of labor due to transaminitis,  progressing well on pitocin  Labor: Progressing normally Preeclampsia:  n/a Fetal Wellbeing:  Category II with variable decelerations.  IUPC placed and will amnioinfuse. Pain Control:  Epidural I/D:  n/a Anticipated MOD:  NSVD  Nichole Curtis 07/03/2019, 5:16 AM

## 2019-07-03 NOTE — Lactation Note (Signed)
This note was copied from a baby's chart. Lactation Consultation Note  Patient Name: Nichole Curtis [id number] Date: 07/03/2019 Reason for consult: Initial assessment;Early term 61-38.6wks  P2 mother whose infant is now 23 hours old.  This is an ETI at 37+3 weeks.  Mother breast fed her first child (now 89 1/24 years old) for 2 months.  Mother is on enteric precautions due to  displaying GI symptoms and having a fever.  She stated that she currently feels "much better" now.  Baby has not latched to the breast yet, however, mother has attempted.  He has had a few "spitty" episodes.  Parents familiar with using the bulb syringe.  He started to gag while I was speaking with mother and I burped him well but did not have to intervene with the syringe.    Offered to initiate the DEBP for mother to help stimulate her breasts.  Mother very willing to begin pumping.  Pump parts, assembly, disassembly and cleaning reviewed.  #24 flange size is appropriate at this time, however, she may need to change to a #27 in the near future.  Mother aware of sizing to obtain the correct flange size.  Her breasts are large, soft and non tender and nipples are short shafted and everted.  Observed mother pumping for approximately 10 minutes while reviewing breast feeding basics including STS, feeding cues, hand expression, how to feed back any EBM she obtains and how to obtain rest periods.  Mother verbalized understanding.    Encouraged to feed every three hours or sooner if baby shows feeding cues.  Mother will finger feed any EBM she obtains and practice hand expression before/after feedings to help increase milk supply.    Mother has a DEBP for home use.  No brochure given due to unavailability.  Father present.  She will call her RN/LC as needed for latch assistance.   Maternal Data Formula Feeding for Exclusion: No Has patient been taught Hand Expression?: Yes Does the patient have breastfeeding experience prior to  this delivery?: Yes  Feeding    LATCH Score                   Interventions    Lactation Tools Discussed/Used Tools: Pump Breast pump type: Double-Electric Breast Pump Pump Review: Setup, frequency, and cleaning;Milk Storage Initiated by:: Nichole Curtis Date initiated:: 07/03/19   Consult Status Consult Status: Follow-up Date: 07/04/19 Follow-up type: In-patient    Nichole Curtis 07/03/2019, 3:42 PM

## 2019-07-03 NOTE — Anesthesia Postprocedure Evaluation (Signed)
Anesthesia Post Note  Patient: Nichole Curtis  Procedure(s) Performed: AN AD HOC LABOR EPIDURAL     Patient location during evaluation: Mother Baby Anesthesia Type: Epidural Level of consciousness: awake and alert Pain management: pain level controlled Vital Signs Assessment: post-procedure vital signs reviewed and stable Respiratory status: spontaneous breathing, nonlabored ventilation and respiratory function stable Cardiovascular status: stable Postop Assessment: no headache, no backache and epidural receding Anesthetic complications: no    Last Vitals:  Vitals:   07/03/19 1130 07/03/19 1409  BP:  (!) 106/59  Pulse:  91  Resp:  20  Temp: 37.6 C 36.7 C  SpO2:      Last Pain:  Vitals:   07/03/19 1409  TempSrc:   PainSc: 0-No pain   Pain Goal: Patients Stated Pain Goal: 2 (07/02/19 2761)                 Minerva Areola

## 2019-07-04 LAB — CBC
HCT: 34.6 % — ABNORMAL LOW (ref 36.0–46.0)
Hemoglobin: 10.9 g/dL — ABNORMAL LOW (ref 12.0–15.0)
MCH: 25.6 pg — ABNORMAL LOW (ref 26.0–34.0)
MCHC: 31.5 g/dL (ref 30.0–36.0)
MCV: 81.2 fL (ref 80.0–100.0)
Platelets: 198 10*3/uL (ref 150–400)
RBC: 4.26 MIL/uL (ref 3.87–5.11)
RDW: 14 % (ref 11.5–15.5)
WBC: 9.1 10*3/uL (ref 4.0–10.5)
nRBC: 0 % (ref 0.0–0.2)

## 2019-07-04 LAB — URINE CULTURE
Culture: NO GROWTH
Special Requests: NORMAL

## 2019-07-04 LAB — GASTROINTESTINAL PANEL BY PCR, STOOL (REPLACES STOOL CULTURE)

## 2019-07-04 LAB — RH IG WORKUP (INCLUDES ABO/RH)
ABO/RH(D): O NEG
Fetal Screen: NEGATIVE
Gestational Age(Wks): 37
Unit division: 0

## 2019-07-04 LAB — COMPREHENSIVE METABOLIC PANEL
ALT: 205 U/L — ABNORMAL HIGH (ref 0–44)
AST: 102 U/L — ABNORMAL HIGH (ref 15–41)
Albumin: 1.9 g/dL — ABNORMAL LOW (ref 3.5–5.0)
Alkaline Phosphatase: 191 U/L — ABNORMAL HIGH (ref 38–126)
Anion gap: 11 (ref 5–15)
BUN: <5 mg/dL — ABNORMAL LOW (ref 6–20)
CO2: 21 mmol/L — ABNORMAL LOW (ref 22–32)
Calcium: 8.5 mg/dL — ABNORMAL LOW (ref 8.9–10.3)
Chloride: 107 mmol/L (ref 98–111)
Creatinine, Ser: 0.59 mg/dL (ref 0.44–1.00)
GFR calc Af Amer: >60 mL/min (ref >60)
GFR calc non Af Amer: >60 mL/min (ref >60)
Glucose, Bld: 83 mg/dL (ref 70–99)
Potassium: 3.2 mmol/L — ABNORMAL LOW (ref 3.5–5.1)
Sodium: 139 mmol/L (ref 135–145)
Total Bilirubin: 0.4 mg/dL (ref 0.3–1.2)
Total Protein: 5.2 g/dL — ABNORMAL LOW (ref 6.5–8.1)

## 2019-07-04 NOTE — Progress Notes (Signed)
CSW acknowledged consult and attempted to meet with MOB 2x. However, bedside nurse was attending to [facility].  CSW will meet with MOB at a later time.  Andromeda Poppen D. Dortha Kern, MSW, [facility] Clinical Social Worker [phone]

## 2019-07-04 NOTE — Progress Notes (Signed)
CSW received consult for hx of Anxiety and panic attacks.    Due to contact precautions, CSW met with MOB via telephone to offer support and complete assessment. MOB reported she was doing "really good". MOB denied any SI, HI, or domestic violence. MOB identified FOB, mom, daughter, MIL, and FOB's family as support.   MOB reported history of anxiety and panic attacks. MOB denied any additional mental health dx. MOB related anxiety history to grief and loss associated with her father death. MOB explained panic attack sx such as tightness in chest and radiating pain in arm. MOB reports taking SSRI during that times, 2018-2020. MOB denies having any sx for over a year. MOB expressed she has been coping well since discontinuing medication. MOB reports coloring, reading books, playing with 40 year old daughter, and talking with FOB as coping skills.    CSW provided education regarding the baby blues period vs. perinatal mood disorders, discussed treatment and gave resources for mental health follow up if concerns arise.  CSW recommends self-evaluation during the postpartum time period using the New Mom Checklist from Postpartum Progress and encouraged MOB to contact a medical professional if symptoms are noted at any time.  MOB denied any questions. MOB denied any history of postpartum depression or anxiety.   CSW provided review of Sudden Infant Death Syndrome (SIDS) precautions. MOB confirmed having all needed items for baby including new car seat and bassinet for baby's safe sleep.  CSW identifies no further need for intervention and no barriers to discharge at this time.  Nichole Curtis D. Lissa Morales, MSW, [facility] Clinical Social Worker [phone]

## 2019-07-04 NOTE — Progress Notes (Signed)
Post Partum Day 1 Subjective: no complaints, up ad lib, voiding and tolerating PO.  No HA or nausea/vomiting.  Loose stools only.  On enteric precautions.  Requests circ.  Objective: Blood pressure 96/62, pulse 70, temperature 97.7 F (36.5 C), temperature source Oral, resp. rate 18, height 5\' 2"  (1.575 m), weight 87.5 kg, last menstrual period 10/20/2018, SpO2 93 %, unknown if currently breastfeeding.  Physical Exam:  General: alert, cooperative and appears stated age Lochia: appropriate Uterine Fundus: firm Incision: healing well, no significant drainage, no dehiscence DVT Evaluation: No evidence of DVT seen on physical exam. Negative Homan's sign. No cords or calf tenderness.  Recent Labs    07/03/19 0925 07/04/19 0511  HGB 10.9* 10.9*  HCT 35.0* 34.6*  AST 217->102 ALT 285->205  Assessment/Plan: Plan for discharge tomorrow and Breastfeeding  Counseled for circ including risk of bleeding, infection, and scarring.  All questions were answered and patient wishes to proceed. Transaminitis-improving.  Labs ordered for tomorrow AM.  Patient continues to improve clinically Postpartum fever-Afebrile x 24 hours.  S/P Gent/Clinda.  Blood and urine cx pending.  Stool sample pending.  Likely related to viral gastroenteritis.  Will continue to monitor.      LOS: 2 days   Nichole Curtis 07/04/2019, 9:23 AM

## 2019-07-04 NOTE — Lactation Note (Signed)
This note was copied from a baby's chart. Lactation Consultation Note  Patient Name: Nichole Curtis [id number] Date: 07/04/2019   Infant is 31 hrs old & was at 7% weight loss when weighed this morning. Mom reports that infant has only latched once today and that was on & off "for about an hour." Mom knows she can ask for latch assist, but I also explained that infant's gestation may be influencing his desire to feed.   Formula was begun a couple of hrs ago. Mom said it sounded as if infant was gulping with bottle feeding. I provided a slower flow (yellow Similac) nipple, instead. I told Mom to let us know if the yellow nipple still seems too fast.   I encouraged Mom to pump whenever infant receives formula. Mom says she plans to go up to the size 27 flange the next time she pumps.  Mom wanted my opinion on 2 small fluid-filled vesicles inside of infant's lower lip; I let Dr Ezequiel Essex know & she will come by to assess.   [name] [facility] 07/04/2019, 1:14 PM

## 2019-07-05 LAB — COMPREHENSIVE METABOLIC PANEL
ALT: 139 U/L — ABNORMAL HIGH (ref 0–44)
AST: 45 U/L — ABNORMAL HIGH (ref 15–41)
Albumin: 1.8 g/dL — ABNORMAL LOW (ref 3.5–5.0)
Alkaline Phosphatase: 161 U/L — ABNORMAL HIGH (ref 38–126)
Anion gap: 9 (ref 5–15)
BUN: 7 mg/dL (ref 6–20)
CO2: 23 mmol/L (ref 22–32)
Calcium: 8.5 mg/dL — ABNORMAL LOW (ref 8.9–10.3)
Chloride: 110 mmol/L (ref 98–111)
Creatinine, Ser: 0.58 mg/dL (ref 0.44–1.00)
GFR calc Af Amer: >60 mL/min (ref >60)
GFR calc non Af Amer: >60 mL/min (ref >60)
Glucose, Bld: 86 mg/dL (ref 70–99)
Potassium: 3.5 mmol/L (ref 3.5–5.1)
Sodium: 142 mmol/L (ref 135–145)
Total Bilirubin: 0.7 mg/dL (ref 0.3–1.2)
Total Protein: 5.1 g/dL — ABNORMAL LOW (ref 6.5–8.1)

## 2019-07-05 NOTE — Lactation Note (Signed)
This note was copied from a baby's chart. Lactation Consultation Note  Patient Name: Nichole Curtis [id number] Date: 07/05/2019   Mom last pumped 25 mL. Mom had been using size 27 flanges & asked me for the next size up. Looking at her nipples at rest, it seemed very unlikely that she needs size 30 flanges. It appears that Mom would benefit from size 21 flanges. I asked Mom if she would be willing to try them & she said yes. Lightly lubricating the flanges with coconut oil, Mom proceeded to comfortably pump. She was able to express 3 oz! Mom's breasts felt much more comfortable after pumping.    Mom's R breast is larger than her L breast. Mom mentioned that her L breast has always produced less than her R breast. Hand expression was taught to Mom. I suggested that Mom stimulate her R breast more to see if that would help (eg pumping for a few minutes longer on her R breast, hand expressing on her breast after pumping, etc.).   Mom has an Ameda Joy pump at home & she knows that she should purchase their equivalent of a size 21 flange.   Parents know to increase volumes & to let infant drink until content.   [name] [facility] 07/05/2019, 9:31 AM

## 2019-07-05 NOTE — Discharge Summary (Signed)
Obstetric Discharge Summary Reason for Admission: nausea, vomiting and elevated liver enzymes Prenatal Procedures: NST, ultrasound and MFM consult Intrapartum Procedures: spontaneous vaginal delivery Postpartum Procedures: antibiotics Complications-Operative and Postpartum: 2nd degree degree perineal laceration Hemoglobin  Date Value Ref Range Status  07/04/2019 10.9 (L) 12.0 - 15.0 g/dL Final   HCT  Date Value Ref Range Status  07/04/2019 34.6 (L) 36.0 - 46.0 % Final    Physical Exam:  General: cooperative and appears older than stated age 24: appropriate Uterine Fundus: firm Incision: healing well, no significant drainage, no dehiscence, no significant erythema DVT Evaluation: No evidence of DVT seen on physical exam.  Discharge Diagnoses: Term Pregnancy-delivered and viral gastroenteritis  Discharge Information: Date: 07/05/2019 Activity: pelvic rest Diet: routine Medications: PNV Condition: stable Instructions: refer to practice specific booklet Discharge to: home   Newborn Data: Live born female  Birth Weight: 7 lb 0.3 oz (3184 g) APGAR: 6, 8  Newborn Delivery   Birth date/time: 07/03/2019 05:59:00 Delivery type: Vaginal, Spontaneous      Home with mother.  Jeani Hawking 07/05/2019, 7:54 AM

## 2019-07-07 ENCOUNTER — Ambulatory Visit (HOSPITAL_COMMUNITY): Payer: BC Managed Care – PPO

## 2019-07-08 LAB — CULTURE, BLOOD (ROUTINE X 2)
Culture: NO GROWTH
Culture: NO GROWTH
Special Requests: ADEQUATE

## 2019-08-05 NOTE — MAU Note (Cosign Needed)
Addendum to MAU provider note on 06/24/19 for billing purposes:    NST reactive upon review

## 2020-02-25 ENCOUNTER — Encounter (HOSPITAL_BASED_OUTPATIENT_CLINIC_OR_DEPARTMENT_OTHER): Payer: Self-pay | Admitting: Obstetrics & Gynecology

## 2020-02-25 ENCOUNTER — Other Ambulatory Visit: Payer: Self-pay

## 2020-02-25 NOTE — H&P (Signed)
Nichole Curtis is an 24 y.o. female G2P2 who desires sterility.  Patient has tried multiple hormonal options and has not been satisfied.  She has definitely completed child-bearing.    Pertinent Gynecological History: Menses: flow is moderate Bleeding: regular Contraception: OCP (estrogen/progesterone) DES exposure: unknown Blood transfusions: none Sexually transmitted diseases: no past history Previous GYN Procedures: none  Last mammogram: n/a Date: n/a Last pap: unk Date: unk OB History: G2, P2002   Menstrual History: Menarche age: n/a No LMP recorded.    Past Medical History:  Diagnosis Date  . Anxiety    when her dad died  . Asthma   . Eczema   . Headache   . Infection    UTI    Past Surgical History:  Procedure Laterality Date  . NO PAST SURGERIES      Family History  Problem Relation Age of Onset  . Heart disease Mother   . Hypertension Mother   . Heart disease Father     Social History:  reports that she has quit smoking. Her smoking use included e-cigarettes. She has never used smokeless tobacco. She reports previous alcohol use. She reports that she does not use drugs.  Allergies:  Allergies  Allergen Reactions  . Guaifenesin & Derivatives Shortness Of Breath    Can not take robitussin but can take mucinex.  . Other Anaphylaxis and Itching    Walnut and pecans  . Penicillins Hives and Rash    Has patient had a PCN reaction causing immediate rash, facial/tongue/throat swelling, SOB or lightheadedness with hypotension: No Has patient had a PCN reaction causing severe rash involving mucus membranes or skin necrosis: No Has patient had a PCN reaction that required hospitalization No Has patient had a PCN reaction occurring within the last 10 years: Yes If all of the above answers are "NO", then may proceed with Cephalosporin use.  Has patient had a PCN reaction causing immediate rash, facial/tongue/throat swelling, SOB or lightheadedness with  hypotension: No Has patient had a PCN reaction causing severe rash involving mucus membranes or skin necrosis: No Has patient had a PCN reaction that required hospitalization No Has patient had a PCN reaction occurring within the last 10 years: Yes If all of the above answers are "NO", then may proceed with Cephalosporin use.    No medications prior to admission.    Review of Systems  unknown if currently breastfeeding. Physical Exam Constitutional:      Appearance: Normal appearance.  HENT:     Head: Normocephalic and atraumatic.  Pulmonary:     Effort: Pulmonary effort is normal.  Abdominal:     Palpations: Abdomen is soft.  Musculoskeletal:        General: Normal range of motion.     Cervical back: Normal range of motion.  Skin:    General: Skin is warm and dry.  Neurological:     Mental Status: She is alert and oriented to person, place, and time.  Psychiatric:        Mood and Affect: Mood normal.        Behavior: Behavior normal.     No results found for this or any previous visit (from the past 24 hour(s)).  No results found.  Assessment/Plan: 24yo G2P2 with desire for sterility -L/S BTL -Patient is counseled re: risk of bleeding, infection, scarring and damage to surrounding structures.  She is counseled re: risk of failure with possibility for ectopic and most importantly, regret.  All questions were answered and  patient strongly wishes to proceed.  Nichole Curtis 02/25/2020, 7:01 AM

## 2020-02-25 NOTE — Progress Notes (Signed)
Spoke w/ via phone for pre-op interview---pt Lab needs dos----   Cbc, T & S, urine preg            Lab results------none COVID test ------02-29-2020 at 925 Arrive at -------530 am 03-03-2020 NPO after MN NO Solid Food.  Clear liquids from MN until---430 am then npo Medications to take morning of surgery -----albuterol inhaler prn/bring inhaler Diabetic medication -----n/a Patient Special Instructions -----none Pre-Op special Istructions -----none Patient verbalized understanding of instructions that were given at this phone interview. Patient denies shortness of breath, chest pain, fever, cough at this phone interview.

## 2020-02-29 ENCOUNTER — Other Ambulatory Visit (HOSPITAL_COMMUNITY)
Admission: RE | Admit: 2020-02-29 | Discharge: 2020-02-29 | Disposition: A | Discharge status: Home / Self Care | Payer: Medicaid Other | Source: Ambulatory Visit | Attending: Obstetrics & Gynecology | Admitting: Obstetrics & Gynecology

## 2020-02-29 DIAGNOSIS — Z01812 Encounter for preprocedural laboratory examination: Secondary | ICD-10-CM | POA: Insufficient documentation

## 2020-02-29 DIAGNOSIS — Z20822 Contact with and (suspected) exposure to covid-19: Secondary | ICD-10-CM | POA: Diagnosis not present

## 2020-02-29 LAB — SARS CORONAVIRUS 2 (TAT 6-24 HRS): SARS Coronavirus 2: NEGATIVE

## 2020-03-03 ENCOUNTER — Ambulatory Visit (HOSPITAL_BASED_OUTPATIENT_CLINIC_OR_DEPARTMENT_OTHER)
Admission: RE | Admit: 2020-03-03 | Discharge: 2020-03-03 | Disposition: A | Discharge status: Home / Self Care | Payer: Medicaid Other | Attending: Obstetrics & Gynecology | Admitting: Obstetrics & Gynecology

## 2020-03-03 ENCOUNTER — Ambulatory Visit (HOSPITAL_BASED_OUTPATIENT_CLINIC_OR_DEPARTMENT_OTHER): Payer: Medicaid Other | Admitting: Certified Registered"

## 2020-03-03 ENCOUNTER — Encounter (HOSPITAL_BASED_OUTPATIENT_CLINIC_OR_DEPARTMENT_OTHER): Payer: Self-pay | Admitting: Obstetrics & Gynecology

## 2020-03-03 ENCOUNTER — Encounter (HOSPITAL_BASED_OUTPATIENT_CLINIC_OR_DEPARTMENT_OTHER)
Admission: RE | Disposition: A | Discharge status: Home / Self Care | Payer: Self-pay | Source: Home / Self Care | Attending: Obstetrics & Gynecology

## 2020-03-03 ENCOUNTER — Other Ambulatory Visit: Payer: Self-pay

## 2020-03-03 DIAGNOSIS — Z87891 Personal history of nicotine dependence: Secondary | ICD-10-CM | POA: Diagnosis not present

## 2020-03-03 DIAGNOSIS — Z302 Encounter for sterilization: Secondary | ICD-10-CM | POA: Diagnosis not present

## 2020-03-03 DIAGNOSIS — N979 Female infertility, unspecified: Secondary | ICD-10-CM

## 2020-03-03 HISTORY — DX: Panic disorder (episodic paroxysmal anxiety): F41.0

## 2020-03-03 HISTORY — DX: Abnormal levels of other serum enzymes: R74.8

## 2020-03-03 HISTORY — DX: Dorsalgia, unspecified: M54.9

## 2020-03-03 HISTORY — DX: Migraine, unspecified, not intractable, without status migrainosus: G43.909

## 2020-03-03 HISTORY — PX: LAPAROSCOPIC TUBAL LIGATION: SHX1937

## 2020-03-03 LAB — POCT PREGNANCY, URINE: Preg Test, Ur: NEGATIVE

## 2020-03-03 LAB — CBC
HCT: 44.6 % (ref 36.0–46.0)
Hemoglobin: 14.6 g/dL (ref 12.0–15.0)
MCH: 27.7 pg (ref 26.0–34.0)
MCHC: 32.7 g/dL (ref 30.0–36.0)
MCV: 84.6 fL (ref 80.0–100.0)
Platelets: 362 10*3/uL (ref 150–400)
RBC: 5.27 MIL/uL — ABNORMAL HIGH (ref 3.87–5.11)
RDW: 12.9 % (ref 11.5–15.5)
WBC: 6.8 10*3/uL (ref 4.0–10.5)
nRBC: 0 % (ref 0.0–0.2)

## 2020-03-03 LAB — TYPE AND SCREEN
ABO/RH(D): O NEG
Antibody Screen: NEGATIVE

## 2020-03-03 SURGERY — LIGATION, FALLOPIAN TUBE, LAPAROSCOPIC
Anesthesia: General | Laterality: Bilateral

## 2020-03-03 MED ORDER — FENTANYL CITRATE (PF) 100 MCG/2ML IJ SOLN
25.0000 ug | INTRAMUSCULAR | Status: DC | PRN
  Administered 2020-03-03 (×4): 25 ug via INTRAVENOUS
  Administered 2020-03-03: 50 ug via INTRAVENOUS

## 2020-03-03 MED ORDER — ROCURONIUM BROMIDE 10 MG/ML (PF) SYRINGE
PREFILLED_SYRINGE | INTRAVENOUS | Status: AC
  Filled 2020-03-03: qty 10

## 2020-03-03 MED ORDER — OXYCODONE HCL 5 MG PO TABS
ORAL_TABLET | ORAL | Status: AC
  Filled 2020-03-03: qty 1

## 2020-03-03 MED ORDER — MIDAZOLAM HCL 2 MG/2ML IJ SOLN
INTRAMUSCULAR | Status: AC
  Filled 2020-03-03: qty 2

## 2020-03-03 MED ORDER — POVIDONE-IODINE 10 % EX SWAB: 2.0000 "application " | Freq: Once | CUTANEOUS | Status: DC

## 2020-03-03 MED ORDER — BUPIVACAINE HCL (PF) 0.25 % IJ SOLN
INTRAMUSCULAR | Status: DC | PRN
  Administered 2020-03-03: 2 mL

## 2020-03-03 MED ORDER — KETOROLAC TROMETHAMINE 30 MG/ML IJ SOLN
INTRAMUSCULAR | Status: AC
  Filled 2020-03-03: qty 1

## 2020-03-03 MED ORDER — FENTANYL CITRATE (PF) 100 MCG/2ML IJ SOLN
INTRAMUSCULAR | Status: DC | PRN
  Administered 2020-03-03: 100 ug via INTRAVENOUS

## 2020-03-03 MED ORDER — ACETAMINOPHEN 160 MG/5ML PO SOLN: 1000.0000 mg | Freq: Once | ORAL | Status: AC | PRN

## 2020-03-03 MED ORDER — MIDAZOLAM HCL 5 MG/5ML IJ SOLN
INTRAMUSCULAR | Status: DC | PRN
  Administered 2020-03-03: 2 mg via INTRAVENOUS

## 2020-03-03 MED ORDER — SUGAMMADEX SODIUM 200 MG/2ML IV SOLN
INTRAVENOUS | Status: DC | PRN
  Administered 2020-03-03: 175 mg via INTRAVENOUS

## 2020-03-03 MED ORDER — ACETAMINOPHEN 500 MG PO TABS
1000.0000 mg | ORAL_TABLET | Freq: Once | ORAL | Status: AC | PRN
  Administered 2020-03-03: 1000 mg via ORAL

## 2020-03-03 MED ORDER — KETOROLAC TROMETHAMINE 30 MG/ML IJ SOLN
INTRAMUSCULAR | Status: DC | PRN
  Administered 2020-03-03: 30 mg via INTRAVENOUS

## 2020-03-03 MED ORDER — LACTATED RINGERS IV SOLN: INTRAVENOUS | Status: DC

## 2020-03-03 MED ORDER — OXYCODONE HCL 5 MG/5ML PO SOLN
5.0000 mg | Freq: Once | ORAL | Status: AC | PRN
Start: 2020-03-03 — End: 2020-03-03

## 2020-03-03 MED ORDER — PROPOFOL 10 MG/ML IV BOLUS
INTRAVENOUS | Status: DC | PRN
  Administered 2020-03-03: 160 mg via INTRAVENOUS

## 2020-03-03 MED ORDER — PROMETHAZINE HCL 25 MG/ML IJ SOLN: 6.2500 mg | Freq: Once | INTRAMUSCULAR | Status: DC

## 2020-03-03 MED ORDER — ONDANSETRON HCL 4 MG/2ML IJ SOLN
INTRAMUSCULAR | Status: DC | PRN
  Administered 2020-03-03: 4 mg via INTRAVENOUS

## 2020-03-03 MED ORDER — FENTANYL CITRATE (PF) 100 MCG/2ML IJ SOLN
INTRAMUSCULAR | Status: AC
  Filled 2020-03-03: qty 2

## 2020-03-03 MED ORDER — OXYCODONE HCL 5 MG PO TABS
5.0000 mg | ORAL_TABLET | Freq: Once | ORAL | Status: AC | PRN
  Administered 2020-03-03: 5 mg via ORAL

## 2020-03-03 MED ORDER — LIDOCAINE 2% (20 MG/ML) 5 ML SYRINGE
INTRAMUSCULAR | Status: DC | PRN
  Administered 2020-03-03: 80 mg via INTRAVENOUS

## 2020-03-03 MED ORDER — PROPOFOL 10 MG/ML IV BOLUS
INTRAVENOUS | Status: AC
  Filled 2020-03-03: qty 20

## 2020-03-03 MED ORDER — ROCURONIUM BROMIDE 10 MG/ML (PF) SYRINGE
PREFILLED_SYRINGE | INTRAVENOUS | Status: DC | PRN
  Administered 2020-03-03: 50 mg via INTRAVENOUS

## 2020-03-03 MED ORDER — PROMETHAZINE HCL 25 MG/ML IJ SOLN
INTRAMUSCULAR | Status: AC
  Filled 2020-03-03: qty 1

## 2020-03-03 MED ORDER — OXYCODONE-ACETAMINOPHEN 5-325 MG PO TABS: 1.0000 | ORAL_TABLET | ORAL | 0 refills | Status: AC | PRN

## 2020-03-03 MED ORDER — SODIUM CHLORIDE 0.9 % IR SOLN
Status: DC | PRN
  Administered 2020-03-03: 1000 mL

## 2020-03-03 MED ORDER — DEXAMETHASONE SODIUM PHOSPHATE 10 MG/ML IJ SOLN
INTRAMUSCULAR | Status: DC | PRN
  Administered 2020-03-03: 10 mg via INTRAVENOUS

## 2020-03-03 MED ORDER — IBUPROFEN 800 MG PO TABS: 800.0000 mg | ORAL_TABLET | Freq: Four times a day (QID) | ORAL | Status: AC | PRN

## 2020-03-03 MED ORDER — ACETAMINOPHEN 500 MG PO TABS
ORAL_TABLET | ORAL | Status: AC
  Filled 2020-03-03: qty 2

## 2020-03-03 MED ORDER — ACETAMINOPHEN 10 MG/ML IV SOLN: 1000.0000 mg | Freq: Once | INTRAVENOUS | Status: DC | PRN

## 2020-03-03 SURGICAL SUPPLY — 43 items
ADH SKN CLS APL DERMABOND .7 (GAUZE/BANDAGES/DRESSINGS) ×1
APL SKNCLS STERI-STRIP NONHPOA (GAUZE/BANDAGES/DRESSINGS)
BENZOIN TINCTURE PRP APPL 2/3 (GAUZE/BANDAGES/DRESSINGS) IMPLANT
CANISTER SUCT 3000ML PPV (MISCELLANEOUS) IMPLANT
CATH ROBINSON RED A/P 16FR (CATHETERS) ×2 IMPLANT
CLIP FILSHIE TUBAL LIGA STRL (Clip) ×1 IMPLANT
COVER MAYO STAND STRL (DRAPES) ×2 IMPLANT
COVER WAND RF STERILE (DRAPES) ×2 IMPLANT
DERMABOND ADVANCED (GAUZE/BANDAGES/DRESSINGS) ×1
DERMABOND ADVANCED .7 DNX12 (GAUZE/BANDAGES/DRESSINGS) ×1 IMPLANT
DRSG COVADERM PLUS 2X2 (GAUZE/BANDAGES/DRESSINGS) IMPLANT
DRSG OPSITE POSTOP 3X4 (GAUZE/BANDAGES/DRESSINGS) IMPLANT
DRSG TEGADERM 4X4.75 (GAUZE/BANDAGES/DRESSINGS) IMPLANT
DURAPREP 26ML APPLICATOR (WOUND CARE) ×2 IMPLANT
GAUZE 4X4 16PLY RFD (DISPOSABLE) ×2 IMPLANT
GLOVE BIO SURGEON STRL SZ 6 (GLOVE) ×2 IMPLANT
GLOVE BIOGEL PI IND STRL 6 (GLOVE) ×2 IMPLANT
GLOVE BIOGEL PI INDICATOR 6 (GLOVE) ×2
GOWN STRL REUS W/ TWL LRG LVL3 (GOWN DISPOSABLE) ×3 IMPLANT
GOWN STRL REUS W/TWL LRG LVL3 (GOWN DISPOSABLE) ×6
KIT TURNOVER CYSTO (KITS) ×2 IMPLANT
NEEDLE INSUFFLATION 120MM (ENDOMECHANICALS) ×2 IMPLANT
NS IRRIG 500ML POUR BTL (IV SOLUTION) ×2 IMPLANT
PACK LAPAROSCOPY BASIN (CUSTOM PROCEDURE TRAY) ×2 IMPLANT
PACK TRENDGUARD 450 HYBRID PRO (MISCELLANEOUS) IMPLANT
PAD OB MATERNITY 4.3X12.25 (PERSONAL CARE ITEMS) ×2 IMPLANT
PENCIL SMOKE EVACUATOR (MISCELLANEOUS) IMPLANT
SCISSORS LAP 5X35 DISP (ENDOMECHANICALS) IMPLANT
SET SUCTION IRRIG HYDROSURG (IRRIGATION / IRRIGATOR) IMPLANT
SET TUBE SMOKE EVAC HIGH FLOW (TUBING) ×2 IMPLANT
SPONGE GAUZE 2X2 8PLY STRL LF (GAUZE/BANDAGES/DRESSINGS) IMPLANT
STRIP CLOSURE SKIN 1/2X4 (GAUZE/BANDAGES/DRESSINGS) IMPLANT
STRIP CLOSURE SKIN 1/4X4 (GAUZE/BANDAGES/DRESSINGS) IMPLANT
SUT MNCRL AB 3-0 PS2 18 (SUTURE) ×2 IMPLANT
SUT VICRYL 0 UR6 27IN ABS (SUTURE) ×2 IMPLANT
TOWEL OR 17X26 10 PK STRL BLUE (TOWEL DISPOSABLE) ×2 IMPLANT
TRENDGUARD 450 HYBRID PRO PACK (MISCELLANEOUS)
TROCAR 12M 150ML BLUNT (TROCAR) ×1 IMPLANT
TROCAR BLADELESS OPT 5 100 (ENDOMECHANICALS) IMPLANT
TROCAR XCEL NON-BLD 11X100MML (ENDOMECHANICALS) ×2 IMPLANT
TUBE CONNECTING 12X1/4 (SUCTIONS) IMPLANT
WARMER LAPAROSCOPE (MISCELLANEOUS) ×2 IMPLANT
WATER STERILE IRR 500ML POUR (IV SOLUTION) ×2 IMPLANT

## 2020-03-03 NOTE — Anesthesia Preprocedure Evaluation (Addendum)
Anesthesia Evaluation  Patient identified by MRN, date of birth, ID band Patient awake    Reviewed: Allergy & Precautions, NPO status , Patient's Chart, lab work & pertinent test results  History of Anesthesia Complications Negative for: history of anesthetic complications  Airway Mallampati: II  TM Distance: >3 FB Neck ROM: Full    Dental  (+) Dental Advisory Given, Teeth Intact   Pulmonary asthma , neg recent URI, former smoker,  Covid-19 Nucleic Acid Test Results Lab Results      Component                Value               Date                      SARSCOV2NAA              NEGATIVE            02/29/2020                SARSCOV2NAA              NEGATIVE            07/03/2019                SARSCOV2NAA              NEGATIVE            07/01/2019              breath sounds clear to auscultation       Cardiovascular negative cardio ROS   Rhythm:Regular     Neuro/Psych  Headaches, neg Seizures PSYCHIATRIC DISORDERS Anxiety    GI/Hepatic negative GI ROS, Neg liver ROS,   Endo/Other  negative endocrine ROS  Renal/GU negative Renal ROSLab Results      Component                Value               Date                      CREATININE               0.58                07/05/2019                Musculoskeletal negative musculoskeletal ROS (+)   Abdominal   Peds  Hematology negative hematology ROS (+) Lab Results      Component                Value               Date                      WBC                      6.8                 03/03/2020                HGB                      14.6                03/03/2020  HCT                      44.6                03/03/2020                MCV                      84.6                03/03/2020                PLT                      362                 03/03/2020              Anesthesia Other Findings   Reproductive/Obstetrics Lab Results      Component                 Value               Date                      PREGTESTUR               NEGATIVE            03/03/2020                HCG                      <5.0                12/04/2017                                       Anesthesia Physical Anesthesia Plan  ASA: II  Anesthesia Plan: General   Post-op Pain Management:    Induction: Intravenous  PONV Risk Score and Plan: 3 and Midazolam, Dexamethasone and Ondansetron  Airway Management Planned: Oral ETT  Additional Equipment: None  Intra-op Plan:   Post-operative Plan: Extubation in OR  Informed Consent: I have reviewed the patients History and Physical, chart, labs and discussed the procedure including the risks, benefits and alternatives for the proposed anesthesia with the patient or authorized representative who has indicated his/her understanding and acceptance.     Dental advisory given  Plan Discussed with: CRNA and Surgeon  Anesthesia Plan Comments:         Anesthesia Quick Evaluation

## 2020-03-03 NOTE — Progress Notes (Signed)
No change to H&P.  Tatyana Winter Trefz, DO 

## 2020-03-03 NOTE — Transfer of Care (Signed)
Immediate Anesthesia Transfer of Care Note  Patient: Nichole Curtis  Procedure(s) Performed: LAPAROSCOPIC BILATERAL TUBAL LIGATION WITH FILSHIE CLIPS (Bilateral )  Patient Location: PACU  Anesthesia Type:General  Level of Consciousness: awake, alert  and oriented  Airway & Oxygen Therapy: Patient Spontanous Breathing and Patient connected to nasal cannula oxygen  Post-op Assessment: Report given to RN and Post -op Vital signs reviewed and stable  Post vital signs: Reviewed and stable  Last Vitals:  Vitals Value Taken Time  BP 136/94 03/03/20 0820  Temp    Pulse 93 03/03/20 0824  Resp 16 03/03/20 0824  SpO2 100 % 03/03/20 0824  Vitals shown include unvalidated device data.  Last Pain:  Vitals:   03/03/20 0611  TempSrc: Oral  PainSc: 0-No pain      Patients Stated Pain Goal: 6 (03/03/20 4239)  Complications: No complications documented.

## 2020-03-03 NOTE — Anesthesia Procedure Notes (Signed)
Procedure Name: Intubation Date/Time: 03/03/2020 7:26 AM Performed by: Aarica Wax D, CRNA Pre-anesthesia Checklist: Patient identified, Emergency Drugs available, Suction available and Patient being monitored Patient Re-evaluated:Patient Re-evaluated prior to induction Oxygen Delivery Method: Circle system utilized Preoxygenation: Pre-oxygenation with 100% oxygen Induction Type: IV induction Ventilation: Mask ventilation without difficulty Laryngoscope Size: Mac and 3 Grade View: Grade I Tube type: Oral Tube size: 7.0 mm Number of attempts: 1 Airway Equipment and Method: Stylet Placement Confirmation: ETT inserted through vocal cords under direct vision,  positive ETCO2 and breath sounds checked- equal and bilateral Secured at: 21 cm Tube secured with: Tape Dental Injury: Teeth and Oropharynx as per pre-operative assessment

## 2020-03-03 NOTE — Op Note (Signed)
Nichole Curtis 03/03/20  PREOPERATIVE DIAGNOSIS:Desire for sterility  POSTOPERATIVE DIAGNOSIS:SAA  PROCEDURE:Laparoscopic Bilateral Tubal Ligation with Filshie Clips  ANESTHESIA: General endotracheal  COMPLICATIONS: None immediate.  ESTIMATED BLOOD LOSS: Less than 20 ml.  INDICATIONS:24y.o.G2P2002with desired sterility.   FINDINGS:Normal uterus, bilateral fallopian tubes and ovaries.   TECHNIQUE: The patient was taken to the operating room where general anesthesia was obtained without difficulty. She was then placed in the dorsal lithotomy position and prepared and draped in sterile fashion. After an adequate timeout was performed, a bivalved speculum was then placed in the patient's vagina, and the anterior lip of cervix grasped with the single-tooth tenaculum. The hulka clip was advanced into the uterus. The speculum was removed from the vagina.  Attention was then turned to the patient's abdomen where a 10-mm skin incision was made on the umbilical fold. Attempt to place the Verres needle was unsuccessful due to thick anterior abdominal wall. Long trocar was advanced under direct entry using assistance of towel clips placed bilaterally on umbilical ring. Pneumoperitoneum was obtained.  Survey of abdomen and pelvis revealed no abnormalities.  Normal appearing liver edge and appendix.  Filshie clip was advanced and placed across the left tube ensuring the clip completely encompassed the entire tube diameter approximately 3 cm from the cornual portion of the tube. The same was performed on the right fallopian tube.Hemostasis was observed. All instruments were removed from the abdomen. The infraumbilical fascial incision was closed with 0 vicryl in a figure of eight stitch. Skin incisionwasclosed with 3-0 monocryl in a subcuticular fashion.   The uterine manipulator and the tenaculum were removed from the vagina without complications. The patient  tolerated the procedure well. Sponge, lap, and needle counts were correct times two. The patient was then taken to the recovery room awake, extubated and in stable in stable condition.

## 2020-03-03 NOTE — Discharge Instructions (Signed)
Call MD for T>100.4, heavy vaginal bleeding, severe abdominal pain, intractable nausea and/or vomiting, or respiratory distress.  Call office to schedule postop appointment in 2 weeks.  Pelvic rest x 6 weeks.  No driving while taking narcotics.       Post Anesthesia Home Care Instructions  Activity: Get plenty of rest for the remainder of the day. A responsible individual must stay with you for 24 hours following the procedure.  For the next 24 hours, DO NOT: -Drive a car -Advertising copywriter -Drink alcoholic beverages -Take any medication unless instructed by your physician -Make any legal decisions or sign important papers.  Meals: Start with liquid foods such as gelatin or soup. Progress to regular foods as tolerated. Avoid greasy, spicy, heavy foods. If nausea and/or vomiting occur, drink only clear liquids until the nausea and/or vomiting subsides. Call your physician if vomiting continues.  Special Instructions/Symptoms: Your throat may feel dry or sore from the anesthesia or the breathing tube placed in your throat during surgery. If this causes discomfort, gargle with warm salt water. The discomfort should disappear within 24 hours.   Do not take any nonsteroidal anti inflammatories until after 2:00 pm today.DISCHARGE INSTRUCTIONS: Laparoscopy  The following instructions have been prepared to help you care for yourself upon your return home today.  Wound care: Marland Kitchen Do not get the incision wet for the first 24 hours. The incision should be kept clean and dry. . The Band-Aids or dressings may be removed the day after surgery. . Should the incision become sore, red, and swollen after the first week, check with your doctor.  Personal hygiene: . Shower the day after your procedure.  Activity and limitations: . Do NOT drive or operate any equipment today. . Do NOT lift anything more than 15 pounds for 2-3 weeks after surgery. . Do NOT rest in bed all day. . Walking is encouraged.  Walk each day, starting slowly with 5-minute walks 3 or 4 times a day. Slowly increase the length of your walks. . Walk up and down stairs slowly. . Do NOT do strenuous activities, such as golfing, playing tennis, bowling, running, biking, weight lifting, gardening, mowing, or vacuuming for 2-4 weeks. Ask your doctor when it is okay to start.  Diet: Eat a light meal as desired this evening. You may resume your usual diet tomorrow.  Return to work: This is dependent on the type of work you do. For the most part you can return to a desk job within a week of surgery. If you are more active at work, please discuss this with your doctor.  What to expect after your surgery: You may have a slight burning sensation when you urinate on the first day. You may have a very small amount of blood in the urine. Expect to have a small amount of vaginal discharge/light bleeding for 1-2 weeks. It is not unusual to have abdominal soreness and bruising for up to 2 weeks. You may be tired and need more rest for about 1 week. You may experience shoulder pain for 24-72 hours. Lying flat in bed may relieve it.  Call your doctor for any of the following: . Develop a fever of 100.4 or greater . Inability to urinate 6 hours after discharge from hospital . Severe pain not relieved by pain medications . Persistent of heavy bleeding at incision site . Redness or swelling around incision site after a week . Increasing nausea or vomiting  Nothing in the vagina until MD approves and  use pads only for drainage.

## 2020-03-06 ENCOUNTER — Encounter (HOSPITAL_BASED_OUTPATIENT_CLINIC_OR_DEPARTMENT_OTHER): Payer: Self-pay | Admitting: Obstetrics & Gynecology

## 2020-03-09 NOTE — Anesthesia Postprocedure Evaluation (Signed)
Anesthesia Post Note  Patient: Nichole Curtis  Procedure(s) Performed: LAPAROSCOPIC BILATERAL TUBAL LIGATION WITH FILSHIE CLIPS (Bilateral )     Patient location during evaluation: PACU Anesthesia Type: General Level of consciousness: awake and alert Pain management: pain level controlled Vital Signs Assessment: post-procedure vital signs reviewed and stable Respiratory status: spontaneous breathing, nonlabored ventilation, respiratory function stable and patient connected to nasal cannula oxygen Cardiovascular status: blood pressure returned to baseline and stable Postop Assessment: no apparent nausea or vomiting Anesthetic complications: no   No complications documented.  Last Vitals:  Vitals:   03/03/20 0915 03/03/20 1010  BP: (!) 140/94 121/78  Pulse: 75 87  Resp: 20 18  Temp:  36.8 C  SpO2: 95% 98%    Last Pain:  Vitals:   03/06/20 0959  TempSrc:   PainSc: 0-No pain                 Trevor Wilkie

## 2020-05-16 ENCOUNTER — Emergency Department (HOSPITAL_COMMUNITY)
Admission: EM | Admit: 2020-05-16 | Discharge: 2020-05-17 | Disposition: A | Discharge status: Home / Self Care | Payer: Medicaid Other | Attending: Emergency Medicine | Admitting: Emergency Medicine

## 2020-05-16 ENCOUNTER — Other Ambulatory Visit: Payer: Self-pay

## 2020-05-16 ENCOUNTER — Encounter (HOSPITAL_COMMUNITY): Payer: Self-pay | Admitting: Emergency Medicine

## 2020-05-16 DIAGNOSIS — J4540 Moderate persistent asthma, uncomplicated: Secondary | ICD-10-CM | POA: Insufficient documentation

## 2020-05-16 DIAGNOSIS — Z7982 Long term (current) use of aspirin: Secondary | ICD-10-CM | POA: Diagnosis not present

## 2020-05-16 DIAGNOSIS — K5900 Constipation, unspecified: Secondary | ICD-10-CM | POA: Diagnosis not present

## 2020-05-16 DIAGNOSIS — R112 Nausea with vomiting, unspecified: Secondary | ICD-10-CM | POA: Diagnosis not present

## 2020-05-16 DIAGNOSIS — Z87891 Personal history of nicotine dependence: Secondary | ICD-10-CM | POA: Insufficient documentation

## 2020-05-16 DIAGNOSIS — R1011 Right upper quadrant pain: Secondary | ICD-10-CM | POA: Diagnosis not present

## 2020-05-16 LAB — CBC
HCT: 45.3 % (ref 36.0–46.0)
Hemoglobin: 14.4 g/dL (ref 12.0–15.0)
MCH: 27.3 pg (ref 26.0–34.0)
MCHC: 31.8 g/dL (ref 30.0–36.0)
MCV: 86 fL (ref 80.0–100.0)
Platelets: 379 10*3/uL (ref 150–400)
RBC: 5.27 MIL/uL — ABNORMAL HIGH (ref 3.87–5.11)
RDW: 12.8 % (ref 11.5–15.5)
WBC: 10.4 10*3/uL (ref 4.0–10.5)
nRBC: 0 % (ref 0.0–0.2)

## 2020-05-16 LAB — COMPREHENSIVE METABOLIC PANEL
ALT: 53 U/L — ABNORMAL HIGH (ref 0–44)
AST: 26 U/L (ref 15–41)
Albumin: 4.1 g/dL (ref 3.5–5.0)
Alkaline Phosphatase: 88 U/L (ref 38–126)
Anion gap: 10 (ref 5–15)
BUN: 11 mg/dL (ref 6–20)
CO2: 25 mmol/L (ref 22–32)
Calcium: 9.6 mg/dL (ref 8.9–10.3)
Chloride: 104 mmol/L (ref 98–111)
Creatinine, Ser: 0.62 mg/dL (ref 0.44–1.00)
GFR, Estimated: >60 mL/min (ref >60)
Glucose, Bld: 86 mg/dL (ref 70–99)
Potassium: 3.8 mmol/L (ref 3.5–5.1)
Sodium: 139 mmol/L (ref 135–145)
Total Bilirubin: 0.6 mg/dL (ref 0.3–1.2)
Total Protein: 7.3 g/dL (ref 6.5–8.1)

## 2020-05-16 LAB — URINALYSIS, ROUTINE W REFLEX MICROSCOPIC
Bilirubin Urine: NEGATIVE
Glucose, UA: NEGATIVE mg/dL
Hgb urine dipstick: NEGATIVE
Ketones, ur: NEGATIVE mg/dL
Nitrite: NEGATIVE
Protein, ur: NEGATIVE mg/dL
Specific Gravity, Urine: 1.015 (ref 1.005–1.030)
pH: 5 (ref 5.0–8.0)

## 2020-05-16 LAB — LIPASE, BLOOD: Lipase: 25 U/L (ref 11–51)

## 2020-05-16 LAB — I-STAT BETA HCG BLOOD, ED (MC, WL, AP ONLY): I-stat hCG, quantitative: <5 m[IU]/mL (ref <5)

## 2020-05-16 NOTE — ED Triage Notes (Signed)
C/o RUQ pain/tenderness for 2-3 days with nausea.  Denies vomiting and diarrhea.

## 2020-05-17 ENCOUNTER — Emergency Department (HOSPITAL_COMMUNITY): Payer: Medicaid Other

## 2020-05-17 MED ORDER — PANTOPRAZOLE SODIUM 40 MG PO TBEC: 40.0000 mg | DELAYED_RELEASE_TABLET | Freq: Every day | ORAL | 3 refills | Status: AC

## 2020-05-17 MED ORDER — DICYCLOMINE HCL 20 MG PO TABS: 20.0000 mg | ORAL_TABLET | Freq: Three times a day (TID) | ORAL | 0 refills | Status: AC

## 2020-05-17 NOTE — ED Provider Notes (Signed)
[facility] EMERGENCY DEPARTMENT Provider Note   CSN: [id number] Arrival date & time: 05/16/20  1652     History Chief Complaint  Patient presents with  . Abdominal Pain    Nichole Curtis is a 25 y.o. female.  Patient presents to the emergency department for evaluation of abdominal pain.  Patient reports a history of constipation and irritable bowel syndrome.  For the last 2 or 3 days she has had persistent right upper quadrant pain with nausea and vomiting.  She has had intermittent pain and cramping across the lower portion of the abdomen as well.  No diarrhea.  No hematemesis, rectal bleeding.  Patient took a Dulcolax yesterday but has not had a bowel movement.        Past Medical History:  Diagnosis Date  . Anxiety    when her dad died  . Asthma   . Back pain   . Eczema   . Elevated liver enzymes reasolved with 07-03-2019 delivery  . Headache   . Migraine    occ  . Panic attacks     Patient Active Problem List   Diagnosis Date Noted  . Transaminitis 07/02/2019  . Pregnant and not yet delivered in third trimester 07/02/2019  . Moderate persistent asthma without complication 05/28/2019  . Preterm uterine contractions 05/28/2019  . GAD (generalized anxiety disorder) 12/08/2017  . Obesity (BMI 30-39.9) 12/08/2017  . Panic attack as reaction to stress 12/08/2017  . Cigarette nicotine dependence without complication 06/23/2017  . Flexural eczema 06/23/2017  . Situational anxiety 05/13/2016    Past Surgical History:  Procedure Laterality Date  . LAPAROSCOPIC TUBAL LIGATION Bilateral 03/03/2020   Procedure: LAPAROSCOPIC BILATERAL TUBAL LIGATION WITH FILSHIE CLIPS;  Surgeon: Mitchel Honour, DO;  Location: [facility];  Service: Gynecology;  Laterality: Bilateral;  . NO PAST SURGERIES       OB History    Gravida  2   Para  2   Term  2   Preterm      AB      Living  2     SAB      IAB      Ectopic      Multiple  0    Live Births  2           Family History  Problem Relation Age of Onset  . Heart disease Mother   . Hypertension Mother   . Heart disease Father     Social History   Tobacco Use  . Smoking status: Former Smoker    Types: E-cigarettes  . Smokeless tobacco: Never Used  . Tobacco comment: quit with preg 11-2018  Vaping Use  . Vaping Use: Former  Substance Use Topics  . Alcohol use: Not Currently  . Drug use: No    Home Medications Prior to Admission medications   Medication Sig Start Date End Date Taking? Authorizing Provider  pantoprazole (PROTONIX) 40 MG tablet Take 1 tablet (40 mg total) by mouth daily. 05/17/20  Yes Zaylen Susman, Canary Brim, MD  albuterol (PROVENTIL,VENTOLIN) 90 MCG/ACT inhaler Inhale 1-4 puffs into the lungs every 6 (six) hours as needed. Shortness of breath and wheezing    [provider]  aspirin-sod bicarb-citric acid (ALKA-SELTZER) 325 MG TBEF tablet Take 325 mg by mouth every 6 (six) hours as needed.    [provider]  Crisaborole (EUCRISA) 2 % OINT Apply topically daily.    [provider]  montelukast (SINGULAIR) 10 MG tablet  Take 10 mg by mouth at bedtime.    [provider]  oxyCODONE-acetaminophen (PERCOCET) 5-325 MG tablet Take 1 tablet by mouth every 4 (four) hours as needed for severe pain. 03/03/20   Morris, Aundra Millet, DO  UNABLE TO FIND Med Name: clobsinate propiate 0.05% bid for eczma, take 1 week, stop for 1 week    [provider]    Allergies    Guaifenesin & derivatives, Other, and Penicillins  Review of Systems   Review of Systems  Gastrointestinal: Positive for abdominal pain, constipation, nausea and vomiting.  All other systems reviewed and are negative.   Physical Exam Updated Vital Signs BP 123/77   Pulse 76   Temp 98.2 F (36.8 C) (Oral)   Resp 18   LMP 04/25/2020   SpO2 98%   Physical Exam Vitals and nursing note reviewed.  Constitutional:      General: She is not in  acute distress.    Appearance: Normal appearance. She is well-developed and well-nourished.  HENT:     Head: Normocephalic and atraumatic.     Right Ear: Hearing normal.     Left Ear: Hearing normal.     Nose: Nose normal.     Mouth/Throat:     Mouth: Oropharynx is clear and moist and mucous membranes are normal.  Eyes:     Extraocular Movements: EOM normal.     Conjunctiva/sclera: Conjunctivae normal.     Pupils: Pupils are equal, round, and reactive to light.  Cardiovascular:     Rate and Rhythm: Regular rhythm.     Heart sounds: S1 normal and S2 normal. No murmur heard. No friction rub. No gallop.   Pulmonary:     Effort: Pulmonary effort is normal. No respiratory distress.     Breath sounds: Normal breath sounds.  Chest:     Chest wall: No tenderness.  Abdominal:     General: Bowel sounds are normal.     Palpations: Abdomen is soft. There is no hepatosplenomegaly.     Tenderness: There is abdominal tenderness in the right upper quadrant and epigastric area. There is no guarding or rebound. Negative signs include Murphy's sign and McBurney's sign.     Hernia: No hernia is present.  Musculoskeletal:        General: Normal range of motion.     Cervical back: Normal range of motion and neck supple.  Skin:    General: Skin is warm, dry and intact.     Findings: No rash.     Nails: There is no cyanosis.  Neurological:     Mental Status: She is alert and oriented to person, place, and time.     GCS: GCS eye subscore is 4. GCS verbal subscore is 5. GCS motor subscore is 6.     Cranial Nerves: No cranial nerve deficit.     Sensory: No sensory deficit.     Coordination: Coordination normal.     Deep Tendon Reflexes: Strength normal.  Psychiatric:        Mood and Affect: Mood and affect normal.        Speech: Speech normal.        Behavior: Behavior normal.        Thought Content: Thought content normal.     ED Results / Procedures / Treatments   Labs (all labs ordered are  listed, but only abnormal results are displayed) Labs Reviewed  COMPREHENSIVE METABOLIC PANEL - Abnormal; Notable for the following components:  Result Value   ALT 53 (*)    All other components within normal limits  CBC - Abnormal; Notable for the following components:   RBC 5.27 (*)    All other components within normal limits  URINALYSIS, ROUTINE W REFLEX MICROSCOPIC - Abnormal; Notable for the following components:   APPearance HAZY (*)    Leukocytes,Ua TRACE (*)    Bacteria, UA RARE (*)    All other components within normal limits  LIPASE, BLOOD  I-STAT BETA HCG BLOOD, ED ([facility], [facility], [facility] ONLY)    EKG None  Radiology DG ABD ACUTE 2+V W 1V CHEST  Result Date: 05/17/2020 CLINICAL DATA:  Right upper quadrant pain EXAM: DG ABDOMEN ACUTE WITH 1 VIEW CHEST COMPARISON:  None. FINDINGS: There is no evidence of dilated bowel loops or free intraperitoneal air. No radiopaque calculi or other significant radiographic abnormality is seen. Heart size and mediastinal contours are within normal limits. Both lungs are clear. IMPRESSION: Negative abdominal radiographs.  No acute cardiopulmonary disease. Electronically Signed   By: Deatra Robinson M.D.   On: 05/17/2020 01:08   US ABDOMEN LIMITED RUQ (LIVER/GB)  Result Date: 05/17/2020 CLINICAL DATA:  Right upper quadrant pain EXAM: ULTRASOUND ABDOMEN LIMITED RIGHT UPPER QUADRANT COMPARISON:  None. FINDINGS: Gallbladder: No gallstones or wall thickening visualized. No sonographic Murphy sign noted by sonographer. Common bile duct: Diameter: 3 mm Liver: No focal lesion identified. Within normal limits in parenchymal echogenicity. Portal vein is patent on color Doppler imaging with normal direction of blood flow towards the liver. Other: None. IMPRESSION: Normal right upper quadrant ultrasound. Electronically Signed   By: Deatra Robinson M.D.   On: 05/17/2020 01:35    Procedures Procedures   Medications Ordered in ED Medications - No data to  display  ED Course  I have reviewed the triage vital signs and the nursing notes.  Pertinent labs & imaging results that were available during my care of the patient were reviewed by me and considered in my medical decision making (see chart for details).    MDM Rules/Calculators/A&P                          Patient presents to the emergency department for evaluation of abdominal pain.  Patient experiencing 2-3 days of persistent right upper quadrant pain with associated nausea and vomiting.  Reviewing her records reveals a history of elevated liver enzymes during pregnancy, LFTs are normal currently.  Ultrasound right upper quadrant was performed and is normal.  Patient also complaining of sensation of constipation.  She has a history of irritable bowel syndrome with constipation features.  Remainder of abdominal exam is benign and labs are unremarkable.  Acute abdominal series x-ray does not show any acute pathology.  Some stool is present in the ascending and transverse colon.  Will need follow-up with gastroenterology.  Final Clinical Impression(s) / ED Diagnoses Final diagnoses:  Right upper quadrant abdominal pain    Rx / DC Orders ED Discharge Orders         Ordered    pantoprazole (PROTONIX) 40 MG tablet  Daily        05/17/20 0147           Gilda Crease, MD 05/17/20 [phone]

## 2020-05-17 NOTE — ED Notes (Signed)
Pt verbalized understanding of d/c instructions, follow up and medications. Pt ambulatory to WR with significant other, steady gait, NAD.

## 2020-11-23 ENCOUNTER — Other Ambulatory Visit: Payer: Self-pay

## 2020-11-23 ENCOUNTER — Encounter: Payer: Self-pay | Admitting: Physical Therapy

## 2020-11-23 ENCOUNTER — Ambulatory Visit: Payer: Medicaid Other | Attending: Obstetrics & Gynecology | Admitting: Physical Therapy

## 2020-11-23 DIAGNOSIS — M6281 Muscle weakness (generalized): Secondary | ICD-10-CM | POA: Insufficient documentation

## 2020-11-23 DIAGNOSIS — R293 Abnormal posture: Secondary | ICD-10-CM | POA: Diagnosis present

## 2020-11-23 NOTE — Patient Instructions (Signed)
Access Code: [email or web address] URL: [email or web address] Date: 11/23/2020 Prepared by: Dwana Curd  Exercises Supine Pelvic Floor Contraction - 3 x daily - 7 x weekly - 1 sets - 10 reps - 3 sec hold Mini Squat with Pelvic Floor Contraction - 1 x daily - 7 x weekly - 2 sets - 10 reps Sit to Stand with Pelvic Floor Contraction - 3 x daily - 7 x weekly - 1 sets - 10 reps

## 2020-11-23 NOTE — Therapy (Signed)
[facility] [address] [place], [place], [address] Phone: [phone]   Fax:  [phone]  Physical Therapy Evaluation  Patient Details  Name: Nichole Curtis MRN: [id number] Date of Birth: July 02, 1995 Referring Provider (PT): Mitchel Honour, DO   Encounter Date: 11/23/2020   PT End of Session - 11/23/20 1024     Visit Number 1    Date for PT Re-Evaluation 02/15/21    Authorization Type medicaid [employer] community    PT Start Time [phone]    PT Stop Time 1022    PT Time Calculation (min) 31 min    Activity Tolerance Patient tolerated treatment well    Behavior During Therapy [facility] for tasks assessed/performed             Past Medical History:  Diagnosis Date   Anxiety    when her dad died   Asthma    Back pain    Eczema    Elevated liver enzymes reasolved with 07-03-2019 delivery   Headache    Migraine    occ   Panic attacks     Past Surgical History:  Procedure Laterality Date   LAPAROSCOPIC TUBAL LIGATION Bilateral 03/03/2020   Procedure: LAPAROSCOPIC BILATERAL TUBAL LIGATION WITH FILSHIE CLIPS;  Surgeon: Mitchel Honour, DO;  Location: [facility];  Service: Gynecology;  Laterality: Bilateral;   NO PAST SURGERIES      There were no vitals filed for this visit.    Subjective Assessment - 11/23/20 0955     Subjective States she is wearing pad and leaking all the time with bending, coughing, sneezing.    Currently in Pain? Yes    Pain Score --   with during intercourse   Pain Descriptors / Indicators Discomfort    Pain Radiating Towards just feels like irritation and dryness    Aggravating Factors  sex    Multiple Pain Sites No                [facility] PT Assessment - 11/23/20 0001       Assessment   Medical Diagnosis N94.10 (ICD-10-CM) - Unspecified dyspareunia    Referring Provider (PT) Morris, Roni, DO    Prior Therapy No      Precautions   Precautions None      Balance  Screen   Has the patient fallen in the past 6 months Yes      Home Environment   Living Environment Private residence    Living Arrangements Spouse/significant other;Children   2 kids     Prior Function   Level of Independence Independent    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      Cognition   Overall Cognitive Status Within Functional Limits for tasks assessed      Posture/Postural Control   Posture/Postural Control Postural limitations    Postural Limitations Weight shift right;Anterior pelvic tilt   left shift of pelvis     ROM / Strength   AROM / PROM / Strength AROM;PROM;Strength      AROM   Overall AROM Comments lumbar 75% flex      PROM   Overall PROM Comments hip ROM normal      Flexibility   Soft Tissue Assessment /Muscle Length yes    Hamstrings 75%      Palpation   Palpation comment lumbar paraspinals tight      Special Tests   Other special tests ASLR positive with pelvic compression demo core weakness  Objective measurements completed on examination: See above findings.     Pelvic Floor Special Questions - 11/23/20 0001     Are you Pregnant or attempting pregnancy? No    Prior Pregnancies Yes    Number of Pregnancies 2    Number of Vaginal Deliveries 2    Diastasis Recti 1 finger and 1 knucke superior to umbilicus    Currently Sexually Active Yes    Is this Painful Yes    Marinoff Scale pain interrupts completion    Urinary urgency Yes    Fecal incontinence --   constipation   Falling out feeling (prolapse) No    External Palpation outside clothing, lift and hold 5 sec; 5 quick flicks, more lift on Rt side    Exam Type Deferred              [facility] Adult PT Treatment/Exercise - 11/23/20 0001       Self-Care   Self-Care Other Self-Care Comments    Other Self-Care Comments  iitial HEP                    PT Education - 11/23/20 1049     Education Details Access Code: [email or web address]    Person(s)  Educated Patient    Methods Explanation;Demonstration;Tactile cues;Verbal cues;Handout    Comprehension Verbalized understanding;Returned demonstration              PT Short Term Goals - 11/23/20 1049       PT SHORT TERM GOAL #1   Title ind with initial HEP and notices 20% less leakage    Time 4    Period Weeks    Status New    Target Date 12/21/20               PT Long Term Goals - 11/23/20 1025       PT LONG TERM GOAL #1   Title Pt will be able to pick up her son from the crip without leakage    Baseline leaking all the time when lifting him and crib is the hardest due to railings are high    Time 12    Period Weeks    Status New    Target Date 02/15/21      PT LONG TERM GOAL #2   Title Pt will be able to have intercourse without pain due to improved soft tissue length    Time 12    Period Weeks    Status New    Target Date 02/15/21      PT LONG TERM GOAL #3   Title Pt will be able to sustain pelvic floor contraction for at least 15 seconds for improved bladder control when getting to the bathroom    Time 12    Period Weeks    Status New    Target Date 02/15/21      PT LONG TERM GOAL #4   Title Pt will have BMs without straining to reduce the risk of prolapse    Time 12    Period Weeks    Status New    Target Date 02/15/21                    Plan - 11/23/20 1036     Clinical Impression Statement Pt arrives at clinic today accompanied by her 2 children due to not having child care today.  Pt and PT agreed to defer pelvic floor assessment without clothing due to 2 active  children in the room and pt not wanting her daughter asking a lot of questions about it.  Pt was assessed with tactile cues and was able to contract and lift and able to sustain lift for 5 seconds  Rt pelvic floor lifting more consistently.  Pt has posture with weight shift right and lateral shift of pelvis right.  Pt has 1 finger and 1 knuckle DRAjust above umbilicus,   Hamstrings are tight bilaterally.  ASLR responded favorably to pelvic stabilization demonstrating some core weakness. Pt will benefit from skilled PT to address all above mentioned impairments so she can restore pelvic health and function.    Personal Factors and Comorbidities Comorbidity 2    Comorbidities significant tearing during first delivery sunny side up delivery; IBS with constipation    Examination-Activity Limitations Toileting;Continence;Carry;Bend;Lift;Squat    Examination-Participation Restrictions Community Activity;Interpersonal Relationship    Stability/Clinical Decision Making Stable/Uncomplicated    Clinical Decision Making Moderate    Rehab Potential Excellent    PT Frequency 1x / week    PT Duration 12 weeks    PT Treatment/Interventions ADLs/Self Care Home Management;Biofeedback;Cryotherapy;Electrical Stimulation;Moist Heat;Therapeutic activities;Therapeutic exercise;Neuromuscular re-education;Patient/family education;Taping;Passive range of motion;Dry needling;Manual techniques    PT Next Visit Plan posture lateral shift; internal soft tissue assessment    PT Home Exercise Plan Access Code: [email or web address]    Consulted and Agree with Plan of Care Patient             Patient will benefit from skilled therapeutic intervention in order to improve the following deficits and impairments:  Pain, Postural dysfunction, Impaired flexibility, Increased fascial restricitons, Decreased strength, Decreased coordination  Visit Diagnosis: Muscle weakness (generalized)  Abnormal posture     Problem List Patient Active Problem List   Diagnosis Date Noted   Transaminitis 07/02/2019   Pregnant and not yet delivered in third trimester 07/02/2019   Moderate persistent asthma without complication 05/28/2019   Preterm uterine contractions 05/28/2019   GAD (generalized anxiety disorder) 12/08/2017   Obesity (BMI 30-39.9) 12/08/2017   Panic attack as reaction to stress 12/08/2017    Cigarette nicotine dependence without complication 06/23/2017   Flexural eczema 06/23/2017   Situational anxiety 05/13/2016    Brayton Caves Quorra Rosene, PT 11/23/2020, 10:59 AM  [facility] [address] [place], [place], [address] Phone: [phone]   Fax:  [phone]  Name: KIYONNA TORTORELLI MRN: [id number] Date of Birth: May 05, 1995

## 2020-12-13 ENCOUNTER — Other Ambulatory Visit: Payer: Self-pay

## 2020-12-13 ENCOUNTER — Ambulatory Visit: Payer: Medicaid Other | Admitting: Physical Therapy

## 2020-12-13 DIAGNOSIS — R293 Abnormal posture: Secondary | ICD-10-CM

## 2020-12-13 DIAGNOSIS — M6281 Muscle weakness (generalized): Secondary | ICD-10-CM

## 2020-12-13 NOTE — Therapy (Signed)
[facility] [address] [place], [place], [address] Phone: [phone]   Fax:  [phone]  Physical Therapy Treatment  Patient Details  Name: Nichole Curtis MRN: [id number] Date of Birth: 1995-05-14 Referring Provider (PT): Linda Hedges, DO   Encounter Date: 12/13/2020   PT End of Session - 12/13/20 1451     Visit Number 2    Date for PT Re-Evaluation 02/15/21    Authorization Type medicaid UHC community    PT Start Time 1231    PT Stop Time 1311    PT Time Calculation (min) 40 min    Activity Tolerance Patient tolerated treatment well    Behavior During Therapy [facility] for tasks assessed/performed             Past Medical History:  Diagnosis Date   Anxiety    when her dad died   Asthma    Back pain    Eczema    Elevated liver enzymes reasolved with 07-03-2019 delivery   Headache    Migraine    occ   Panic attacks     Past Surgical History:  Procedure Laterality Date   LAPAROSCOPIC TUBAL LIGATION Bilateral 03/03/2020   Procedure: LAPAROSCOPIC BILATERAL TUBAL LIGATION WITH FILSHIE CLIPS;  Surgeon: Linda Hedges, DO;  Location: [place];  Service: Gynecology;  Laterality: Bilateral;   NO PAST SURGERIES      There were no vitals filed for this visit.   Subjective Assessment - 12/13/20 1234     Subjective I feel like I am getting better at doing the kegels    Currently in Pain? No/denies                            Pelvic Floor Special Questions - 12/13/20 0001     Pelvic Floor Internal Exam pt identity confirmed and informed consent given    Exam Type Vaginal    Palpation tight and less closure on the Rt side; not TTP    Strength weak squeeze, no lift    Strength # of reps 1    Strength # of seconds 4    Tone high tone of levators               [facility] Adult PT Treatment/Exercise - 12/13/20 0001       Neuro Re-ed    Neuro Re-ed Details  isolating  pelvic floor muscles      Exercises   Exercises Lumbar      Lumbar Exercises: Stretches   Active Hamstring Stretch Right;Left;2 reps;30 seconds    Hip Flexor Stretch Limitations hip rotation stretch - 3x10sec    Other Lumbar Stretch Exercise adductor and butterfly stretch    Other Lumbar Stretch Exercise lateral shift to the right      Lumbar Exercises: Standing   Other Standing Lumbar Exercises modified on table arms straight and elbows bent- kegels with feet straight and toes together - 3 sec hold and 3 sec rest      Manual Therapy   Manual Therapy Internal Pelvic Floor    Manual therapy comments informed consent and identity confirmed to assess and treat    Internal Pelvic Floor stretch to levators bilateraly                       PT Short Term Goals - 12/13/20 1400       PT SHORT TERM  GOAL #1   Title ind with initial HEP and notices 20% less leakage    Baseline a little better    Status Partially Met               PT Long Term Goals - 11/23/20 1025       PT LONG TERM GOAL #1   Title Pt will be able to pick up her son from the crip without leakage    Baseline leaking all the time when lifting him and crib is the hardest due to railings are high    Time 12    Period Weeks    Status New    Target Date 02/15/21      PT LONG TERM GOAL #2   Title Pt will be able to have intercourse without pain due to improved soft tissue length    Time 12    Period Weeks    Status New    Target Date 02/15/21      PT LONG TERM GOAL #3   Title Pt will be able to sustain pelvic floor contraction for at least 15 seconds for improved bladder control when getting to the bathroom    Time 12    Period Weeks    Status New    Target Date 02/15/21      PT LONG TERM GOAL #4   Title Pt will have BMs without straining to reduce the risk of prolapse    Time 12    Period Weeks    Status New    Target Date 02/15/21                   Plan - 12/13/20 1318      Clinical Impression Statement Pt did well with exercises.  She tolerated internal soft tissue assessment without pain or discomfort.  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 Pt has tight internal rotation.  Pt also has lateral shift left so she was educated on shifting to the right. Pt was able to perform kegel in standing.  Pt will benefit from skilled PT to continue to progress strength during more functional positions and movements.    PT Treatment/Interventions ADLs/Self Care Home Management;Biofeedback;Cryotherapy;Electrical Stimulation;Moist Heat;Therapeutic activities;Therapeutic exercise;Neuromuscular re-education;Patient/family education;Taping;Passive range of motion;Dry needling;Manual techniques    PT Next Visit Plan progress kegel with bending and lifting as able, glute med strength on right side, TrA strength    PT Home Exercise Plan Access Code: [email or web address]    Consulted and Agree with Plan of Care Patient             Patient will benefit from skilled therapeutic intervention in order to improve the following deficits and impairments:  Pain, Postural dysfunction, Impaired flexibility, Increased fascial restricitons, Decreased strength, Decreased coordination  Visit Diagnosis: No diagnosis found.     Problem List Patient Active Problem List   Diagnosis Date Noted   Transaminitis 07/02/2019   Pregnant and not yet delivered in third trimester 07/02/2019   Moderate persistent asthma without complication 20/25/4270   Preterm uterine contractions 05/28/2019   GAD (generalized anxiety disorder) 12/08/2017   Obesity (BMI 30-39.9) 12/08/2017   Panic attack as reaction to stress 12/08/2017   Cigarette nicotine dependence without complication 62/37/6283   Flexural eczema 06/23/2017   Situational anxiety 05/13/2016    [name], PT 12/13/2020, 2:52 PM  [facility] [address] [place], [place], [address] Phone: [phone]   Fax:  [phone]  Name: Nichole Curtis MRN: [id number] Date of Birth: 06/25/1995    

## 2020-12-21 ENCOUNTER — Ambulatory Visit: Payer: Medicaid Other | Admitting: Physical Therapy

## 2020-12-21 ENCOUNTER — Encounter: Payer: Self-pay | Admitting: Physical Therapy

## 2020-12-21 ENCOUNTER — Other Ambulatory Visit: Payer: Self-pay

## 2020-12-21 DIAGNOSIS — M6281 Muscle weakness (generalized): Secondary | ICD-10-CM | POA: Diagnosis not present

## 2020-12-21 DIAGNOSIS — R293 Abnormal posture: Secondary | ICD-10-CM

## 2020-12-21 NOTE — Patient Instructions (Signed)
Access Code: [email or web address] URL: [email or web address] Date: 12/21/2020 Prepared by: Dwana Curd  Exercises Supine Pelvic Floor Contraction - 3 x daily - 7 x weekly - 1 sets - 10 reps - 3 sec hold Mini Squat with Pelvic Floor Contraction - 1 x daily - 7 x weekly - 2 sets - 10 reps Sit to Stand with Pelvic Floor Contraction - 3 x daily - 7 x weekly - 1 sets - 10 reps Right Standing Lateral Shift Correction at Wall - Repetitions - 1 x daily - 7 x weekly - 1 sets - 10 reps - 10 sec hold Table Lean - 3 x daily - 7 x weekly - 1 sets - 8-10 reps - 5 sec hold Supine Butterfly Groin Stretch - 1 x daily - 7 x weekly - 3 reps - 1 sets - 30 sec hold Supine Hip Internal and External Rotation - 1 x daily - 7 x weekly - 10 reps - 1 sets - 5 sec hold Half Deadlift with Kettlebell - 1 x daily - 7 x weekly - 3 sets - 10 reps Thoracic Extension Mobilization with Noodle - 1 x daily - 7 x weekly - 3 sets - 10 reps Seated Thoracic Lumbar Extension with Pectoralis Stretch - 1 x daily - 7 x weekly - 1 sets - 10 reps - 5 hold

## 2020-12-21 NOTE — Therapy (Signed)
[facility] [address], [address] [place], [place], [address] Phone: [phone]   Fax:  [phone]  Physical Therapy Treatment  Patient Details  Name: Nichole Curtis MRN: [id number] Date of Birth: 02/27/1996 Referring Provider (PT): Mitchel Honour, DO   Encounter Date: 12/21/2020   PT End of Session - 12/21/20 1014     Visit Number 3    Date for PT Re-Evaluation 02/15/21    Authorization Type medicaid UHC community    PT Start Time 1015    PT Stop Time 1055    PT Time Calculation (min) 40 min    Activity Tolerance Patient tolerated treatment well    Behavior During Therapy [facility] for tasks assessed/performed             Past Medical History:  Diagnosis Date   Anxiety    when her dad died   Asthma    Back pain    Eczema    Elevated liver enzymes reasolved with 07-03-2019 delivery   Headache    Migraine    occ   Panic attacks     Past Surgical History:  Procedure Laterality Date   LAPAROSCOPIC TUBAL LIGATION Bilateral 03/03/2020   Procedure: LAPAROSCOPIC BILATERAL TUBAL LIGATION WITH FILSHIE CLIPS;  Surgeon: Mitchel Honour, DO;  Location: [facility];  Service: Gynecology;  Laterality: Bilateral;   NO PAST SURGERIES      There were no vitals filed for this visit.   Subjective Assessment - 12/21/20 1029     Subjective I am mostly leaking when lifting a lot like when I have my nephew and when trying to get to the bathroom    Currently in Pain? No/denies                               [facility] Adult PT Treatment/Exercise - 12/21/20 0001       Self-Care   Other Self-Care Comments  urge techniques      Lumbar Exercises: Standing   Lifting --   dead lift 3lb weights - cues for posture   Other Standing Lumbar Exercises modified on table arms straight and elbows bent- kegels with feet straight and toes together - 3 sec hold and 3 sec rest      Lumbar Exercises: Seated    Other Seated Lumbar Exercises on ball - circles, march, LAQ - 10x      Lumbar Exercises: Supine   AB Set Limitations kegel in hooklying 5 sec hold x 10    Bridge 10 reps;5 seconds   ball squeeze                    PT Education - 12/21/20 1059     Education Details Access Code: [email or web address]    Person(s) Educated Patient    Methods Explanation;Demonstration;Tactile cues;Verbal cues;Handout    Comprehension Verbalized understanding;Returned demonstration              PT Short Term Goals - 12/21/20 1126       PT SHORT TERM GOAL #1   Title ind with initial HEP and notices 20% less leakage    Status Achieved               PT Long Term Goals - 12/21/20 1022       PT LONG TERM GOAL #1   Title Pt will be able to pick up her son from the crip  without leakage    Baseline less mostly when putting him down in the crib    Status On-going      PT LONG TERM GOAL #2   Title Pt will be able to have intercourse without pain due to improved soft tissue length    Baseline hasn't been much pain    Status On-going      PT LONG TERM GOAL #3   Title Pt will be able to sustain pelvic floor contraction for at least 15 seconds for improved bladder control when getting to the bathroom    Status On-going      PT LONG TERM GOAL #4   Title Pt will have BMs without straining to reduce the risk of prolapse    Status On-going                   Plan - 12/21/20 1101     Clinical Impression Statement Today's session focused on lifting technique.  Pt did well needing cues to keep back flat and tuck tailbone when coming back up.  Pt has been having less leakage and feels better overall with the back exercises. Pt was educated in new exericses to continue working on strength and posture and will add 5 sec hold for kegel exercise 3x/day to work on endurance.    Comorbidities significant tearing during first delivery sunny side up delivery; IBS with constipation    PT  Treatment/Interventions ADLs/Self Care Home Management;Biofeedback;Cryotherapy;Electrical Stimulation;Moist Heat;Therapeutic activities;Therapeutic exercise;Neuromuscular re-education;Patient/family education;Taping;Passive range of motion;Dry needling;Manual techniques    PT Next Visit Plan f/u on urge and educate on toileting if not already known. progress kegel with bending and lifting as able, glute med strength on right side, TrA strength    PT Home Exercise Plan Access Code: [email or web address], urge    Consulted and Agree with Plan of Care Patient             Patient will benefit from skilled therapeutic intervention in order to improve the following deficits and impairments:  Pain, Postural dysfunction, Impaired flexibility, Increased fascial restricitons, Decreased strength, Decreased coordination  Visit Diagnosis: Muscle weakness (generalized)  Abnormal posture     Problem List Patient Active Problem List   Diagnosis Date Noted   Transaminitis 07/02/2019   Pregnant and not yet delivered in third trimester 07/02/2019   Moderate persistent asthma without complication 05/28/2019   Preterm uterine contractions 05/28/2019   GAD (generalized anxiety disorder) 12/08/2017   Obesity (BMI 30-39.9) 12/08/2017   Panic attack as reaction to stress 12/08/2017   Cigarette nicotine dependence without complication 06/23/2017   Flexural eczema 06/23/2017   Situational anxiety 05/13/2016    [name], PT 12/21/2020, 11:47 AM  [facility] [address] [place], [place], [address] Phone: [phone]   Fax:  [phone]  Name: Nichole Curtis MRN: [id number] Date of Birth: [birth year]

## 2021-01-10 ENCOUNTER — Other Ambulatory Visit: Payer: Self-pay

## 2021-01-10 ENCOUNTER — Encounter: Payer: Self-pay | Admitting: Physical Therapy

## 2021-01-10 ENCOUNTER — Ambulatory Visit: Payer: Medicaid Other | Attending: Physician Assistant | Admitting: Physical Therapy

## 2021-01-10 DIAGNOSIS — M6281 Muscle weakness (generalized): Secondary | ICD-10-CM | POA: Insufficient documentation

## 2021-01-10 DIAGNOSIS — R293 Abnormal posture: Secondary | ICD-10-CM | POA: Diagnosis present

## 2021-01-10 NOTE — Therapy (Signed)
[facility] [address] [place], [place], [address] Phone: [phone]   Fax:  [phone]  Physical Therapy Treatment  Patient Details  Name: Nichole Curtis MRN: [id number] Date of Birth: 10/28/1995 Referring Provider (PT): Mitchel Honour, DO   Encounter Date: 01/10/2021   PT End of Session - 01/10/21 1036     Visit Number 4    Date for PT Re-Evaluation 02/15/21    Authorization Type medicaid UHC community    PT Start Time 1017    PT Stop Time 1055    PT Time Calculation (min) 38 min    Activity Tolerance Patient tolerated treatment well    Behavior During Therapy [name] for tasks assessed/performed             Past Medical History:  Diagnosis Date   Anxiety    when her dad died   Asthma    Back pain    Eczema    Elevated liver enzymes reasolved with 07-03-2019 delivery   Headache    Migraine    occ   Panic attacks     Past Surgical History:  Procedure Laterality Date   LAPAROSCOPIC TUBAL LIGATION Bilateral 03/03/2020   Procedure: LAPAROSCOPIC BILATERAL TUBAL LIGATION WITH FILSHIE CLIPS;  Surgeon: Mitchel Honour, DO;  Location: [facility];  Service: Gynecology;  Laterality: Bilateral;   NO PAST SURGERIES      There were no vitals filed for this visit.   Subjective Assessment - 01/10/21 1020     Subjective I am holding the kegels a little more and not had any leakage.  I have been lifting my son less often and that helps.    Currently in Pain? No/denies                               [facility] Adult PT Treatment/Exercise - 01/10/21 0001       Lumbar Exercises: Supine   Other Supine Lumbar Exercises lying on foam roller - LE dropout; UE band series - 20x      Lumbar Exercises: Sidelying   Hip Abduction Right;Left;15 reps      Lumbar Exercises: Quadruped   Straight Leg Raise 10 reps    Opposite Arm/Leg Raise Right arm/Left leg;Left arm/Right leg;10 reps                        PT Short Term Goals - 12/21/20 1126       PT SHORT TERM GOAL #1   Title ind with initial HEP and notices 20% less leakage    Status Achieved               PT Long Term Goals - 01/10/21 1037       PT LONG TERM GOAL #1   Title Pt will be able to pick up her son from the crib without leakage    Baseline less mostly when putting him down and leaning over    Status On-going      PT LONG TERM GOAL #2   Title Pt will be able to have intercourse without pain due to improved soft tissue length    Baseline haven't had intercourse due to fatigue    Status On-going      PT LONG TERM GOAL #3   Title Pt will be able to sustain pelvic floor contraction for at least 15 seconds for  improved bladder control when getting to the bathroom    Status On-going      PT LONG TERM GOAL #4   Title Pt will have BMs without straining to reduce the risk of prolapse                   Plan - 01/10/21 1044     Clinical Impression Statement Pt did well with core strengthening and progression on foam roller.  Pt has not been having leakge lately.  She is having some fatigue lately and is working with a doctor to figure out what is going on.  Pt needed cues for correct alignment during the exercises but did well with 1-2 cues each time demonstrating good body awareness    PT Treatment/Interventions ADLs/Self Care Home Management;Biofeedback;Cryotherapy;Electrical Stimulation;Moist Heat;Therapeutic activities;Therapeutic exercise;Neuromuscular re-education;Patient/family education;Taping;Passive range of motion;Dry needling;Manual techniques    PT Next Visit Plan core progression; hip machine; and lifting; row machine walking with sports cord    PT Home Exercise Plan Access Code: [email or web address], urge    Consulted and Agree with Plan of Care Patient             Patient will benefit from skilled therapeutic intervention in order to improve the following deficits and  impairments:  Pain, Postural dysfunction, Impaired flexibility, Increased fascial restricitons, Decreased strength, Decreased coordination  Visit Diagnosis: Muscle weakness (generalized)  Abnormal posture     Problem List Patient Active Problem List   Diagnosis Date Noted   Transaminitis 07/02/2019   Pregnant and not yet delivered in third trimester 07/02/2019   Moderate persistent asthma without complication 05/28/2019   Preterm uterine contractions 05/28/2019   GAD (generalized anxiety disorder) 12/08/2017   Obesity (BMI 30-39.9) 12/08/2017   Panic attack as reaction to stress 12/08/2017   Cigarette nicotine dependence without complication 06/23/2017   Flexural eczema 06/23/2017   Situational anxiety 05/13/2016    [name], PT 01/10/2021, 2:05 PM  [name] [address] [place], [place], [address] Phone: [phone]   Fax:  [phone]  Name: Nichole Curtis MRN: [id number] Date of Birth: 06-01-1995

## 2021-01-17 ENCOUNTER — Other Ambulatory Visit: Payer: Self-pay

## 2021-01-17 ENCOUNTER — Ambulatory Visit: Payer: Medicaid Other | Admitting: Physical Therapy

## 2021-01-17 ENCOUNTER — Encounter: Payer: Self-pay | Admitting: Physical Therapy

## 2021-01-17 DIAGNOSIS — M6281 Muscle weakness (generalized): Secondary | ICD-10-CM | POA: Diagnosis not present

## 2021-01-17 DIAGNOSIS — R293 Abnormal posture: Secondary | ICD-10-CM

## 2021-01-17 NOTE — Therapy (Signed)
[facility] [address] [place], [place], [address] Phone: [phone]   Fax:  [phone]  Physical Therapy Treatment  Patient Details  Name: Nichole Curtis MRN: [id number] Date of Birth: [birth year] Referring Provider (PT): Mitchel Honour, DO   Encounter Date: 01/17/2021   PT End of Session - 01/17/21 0933     Visit Number 5    Date for PT Re-Evaluation 02/15/21    Authorization Type medicaid UHC community    PT Start Time [phone]    PT Stop Time 1014    PT Time Calculation (min) 40 min    Activity Tolerance Patient tolerated treatment well    Behavior During Therapy [facility] for tasks assessed/performed             Past Medical History:  Diagnosis Date   Anxiety    when her dad died   Asthma    Back pain    Eczema    Elevated liver enzymes reasolved with 07-03-2019 delivery   Headache    Migraine    occ   Panic attacks     Past Surgical History:  Procedure Laterality Date   LAPAROSCOPIC TUBAL LIGATION Bilateral 03/03/2020   Procedure: LAPAROSCOPIC BILATERAL TUBAL LIGATION WITH FILSHIE CLIPS;  Surgeon: Mitchel Honour, DO;  Location: [facility];  Service: Gynecology;  Laterality: Bilateral;   NO PAST SURGERIES      There were no vitals filed for this visit.   Subjective Assessment - 01/17/21 0945     Subjective I am doing well.  I think I am doing well with the exercises. No leakage and lifting is better.    Currently in Pain? No/denies                               [facility] Adult PT Treatment/Exercise - 01/17/21 0001       Lumbar Exercises: Standing   Lifting From 12";15 reps   dead lift 5lb kettle bell; 10 lb for 8 additional reps   Other Standing Lumbar Exercises side step with blue loop - 10 each way      Lumbar Exercises: Supine   Other Supine Lumbar Exercises lying on foam roller - LE dropout; LE march; chest press and overhead flexion with 5lb kettle bell       Lumbar Exercises: Sidelying   Hip Abduction Right;Left;15 reps                     PT Education - 01/17/21 1014     Education Details Access Code: [email or web address]    Person(s) Educated Patient    Methods Explanation;Demonstration;Tactile cues;Verbal cues;Handout    Comprehension Verbalized understanding;Returned demonstration              PT Short Term Goals - 12/21/20 1126       PT SHORT TERM GOAL #1   Title ind with initial HEP and notices 20% less leakage    Status Achieved               PT Long Term Goals - 01/10/21 1037       PT LONG TERM GOAL #1   Title Pt will be able to pick up her son from the crib without leakage    Baseline less mostly when putting him down and leaning over    Status On-going      PT LONG TERM GOAL #2  Title Pt will be able to have intercourse without pain due to improved soft tissue length    Baseline haven't had intercourse due to fatigue    Status On-going      PT LONG TERM GOAL #3   Title Pt will be able to sustain pelvic floor contraction for at least 15 seconds for improved bladder control when getting to the bathroom    Status On-going      PT LONG TERM GOAL #4   Title Pt will have BMs without straining to reduce the risk of prolapse                   Plan - 01/17/21 0950     Clinical Impression Statement Pt states she hasn't had any leakage and the bladder sheet with information has been helping the urgency.  Pt was able to learn correct lifting techniques with cues from foam roller behind back. Pt did well and able to add kegel to hip hinge and squats.  pt able to continue to progress exercises on the foam roller today.  Pt will benefit from skilled PT to continue to work on funcitonal activities with muscle control for reduced leakage.    PT Treatment/Interventions ADLs/Self Care Home Management;Biofeedback;Cryotherapy;Electrical Stimulation;Moist Heat;Therapeutic activities;Therapeutic exercise;Neuromuscular  re-education;Patient/family education;Taping;Passive range of motion;Dry needling;Manual techniques    PT Next Visit Plan core progression; review squat; hip machine; and lifting; row machine walking with sports cord    PT Home Exercise Plan Access Code: [email or web address], urge    Consulted and Agree with Plan of Care Patient             Patient will benefit from skilled therapeutic intervention in order to improve the following deficits and impairments:  Pain, Postural dysfunction, Impaired flexibility, Increased fascial restricitons, Decreased strength, Decreased coordination  Visit Diagnosis: Muscle weakness (generalized)  Abnormal posture     Problem List Patient Active Problem List   Diagnosis Date Noted   Transaminitis 07/02/2019   Pregnant and not yet delivered in third trimester 07/02/2019   Moderate persistent asthma without complication 05/28/2019   Preterm uterine contractions 05/28/2019   GAD (generalized anxiety disorder) 12/08/2017   Obesity (BMI 30-39.9) 12/08/2017   Panic attack as reaction to stress 12/08/2017   Cigarette nicotine dependence without complication 06/23/2017   Flexural eczema 06/23/2017   Situational anxiety 05/13/2016    [name], PT 01/17/2021, 10:14 AM  [facility] [address] [place], [place], [address] Phone: [phone]   Fax:  [phone]  Name: Nichole Curtis MRN: [id number] Date of Birth: 1995-10-15

## 2021-01-17 NOTE — Patient Instructions (Signed)
Access Code: [email or web address] URL: [email or web address] Date: 01/17/2021 Prepared by: Dwana Curd  Exercises Supine Pelvic Floor Contraction - 3 x daily - 7 x weekly - 1 sets - 10 reps - 3 sec hold Mini Squat with Pelvic Floor Contraction - 1 x daily - 7 x weekly - 2 sets - 10 reps Sit to Stand with Pelvic Floor Contraction - 3 x daily - 7 x weekly - 1 sets - 10 reps Right Standing Lateral Shift Correction at Wall - Repetitions - 1 x daily - 7 x weekly - 1 sets - 10 reps - 10 sec hold Table Lean - 3 x daily - 7 x weekly - 1 sets - 8-10 reps - 5 sec hold Supine Butterfly Groin Stretch - 1 x daily - 7 x weekly - 1 sets - 3 reps - 30 sec hold Supine Hip Internal and External Rotation - 1 x daily - 7 x weekly - 1 sets - 10 reps - 5 sec hold Half Deadlift with Kettlebell - 1 x daily - 7 x weekly - 3 sets - 10 reps Thoracic Extension Mobilization with Noodle - 1 x daily - 7 x weekly - 3 sets - 10 reps Seated Thoracic Lumbar Extension with Pectoralis Stretch - 1 x daily - 7 x weekly - 1 sets - 10 reps - 5 hold Bird Dog - 1 x daily - 7 x weekly - 3 sets - 10 reps Quadruped Alternating Leg Extensions - 1 x daily - 7 x weekly - 3 sets - 10 reps Sidelying Hip Abduction - 1 x daily - 7 x weekly - 3 sets - 10 reps Half Deadlift with Kettlebell - 1 x daily - 7 x weekly - 2 sets - 10 reps Mini Squat with Pelvic Floor Contraction - 1 x daily - 7 x weekly - 2 sets - 10 reps

## 2021-01-24 ENCOUNTER — Encounter: Payer: Medicaid Other | Admitting: Physical Therapy

## 2021-01-31 ENCOUNTER — Encounter: Payer: Medicaid Other | Admitting: Physical Therapy

## 2021-02-14 ENCOUNTER — Other Ambulatory Visit: Payer: Self-pay

## 2021-02-14 ENCOUNTER — Encounter: Payer: Self-pay | Admitting: Physical Therapy

## 2021-02-14 ENCOUNTER — Ambulatory Visit: Payer: Medicaid Other | Attending: Physician Assistant | Admitting: Physical Therapy

## 2021-02-14 DIAGNOSIS — R293 Abnormal posture: Secondary | ICD-10-CM | POA: Insufficient documentation

## 2021-02-14 DIAGNOSIS — M6281 Muscle weakness (generalized): Secondary | ICD-10-CM | POA: Insufficient documentation

## 2021-02-14 NOTE — Therapy (Signed)
[facility] @ [place], [place], [address] Phone: [phone]   Fax:  [phone]  Physical Therapy Treatment  Patient Details  Name: Nichole Curtis MRN: [id number] Date of Birth: 30-Jul-1995 Referring Provider (PT): Linda Hedges, DO   Encounter Date: 02/14/2021   PT End of Session - 02/14/21 0924     Visit Number 6    Date for PT Re-Evaluation 02/15/21    Authorization Type medicaid [employer] community    PT Start Time [phone]    PT Stop Time 1000    PT Time Calculation (min) 42 min    Activity Tolerance Patient tolerated treatment well    Behavior During Therapy [facility] for tasks assessed/performed             Past Medical History:  Diagnosis Date   Anxiety    when her dad died   Asthma    Back pain    Eczema    Elevated liver enzymes reasolved with 07-03-2019 delivery   Headache    Migraine    occ   Panic attacks     Past Surgical History:  Procedure Laterality Date   LAPAROSCOPIC TUBAL LIGATION Bilateral 03/03/2020   Procedure: LAPAROSCOPIC BILATERAL TUBAL LIGATION WITH FILSHIE CLIPS;  Surgeon: Linda Hedges, DO;  Location: [facility];  Service: Gynecology;  Laterality: Bilateral;   NO PAST SURGERIES      There were no vitals filed for this visit.   Subjective Assessment - 02/14/21 1014     Subjective I am doing well    Currently in Pain? No/denies                               [facility] Adult PT Treatment/Exercise - 02/14/21 0001       Lumbar Exercises: Aerobic   Nustep L3 x 8 min with status update      Lumbar Exercises: Standing   Functional Squats 20 reps   heels elevated 10lb kettle bell   Lifting 10 reps    Lifting Weights (lbs) 10lb    Lifting Limitations deadlift - cues to hinge at hips    Other Standing Lumbar Exercises waling with sports cord 5lb all directions 10x each way      Lumbar Exercises: Sidelying   Hip Abduction Right;Left;20 reps                        PT Short Term Goals - 12/21/20 1126       PT SHORT TERM GOAL #1   Title ind with initial HEP and notices 20% less leakage    Status Achieved               PT Long Term Goals - 02/14/21 3545       PT LONG TERM GOAL #1   Title Pt will be able to pick up her son from the crib without leakage    Baseline that is better    Status Achieved      PT LONG TERM GOAL #2   Title Pt will be able to have intercourse without pain due to improved soft tissue length    Baseline been a couple times when it more dry    Status Achieved      PT LONG TERM GOAL #3   Title Pt will be able to sustain pelvic floor contraction for at least 15 seconds  for improved bladder control when getting to the bathroom    Baseline not having leakage    Status Achieved      PT LONG TERM GOAL #4   Title Pt will have BMs without straining to reduce the risk of prolapse    Status Achieved                   Plan - 02/14/21 1011     Clinical Impression Statement Pt is doing well and has met all of her goals.  Reviewed HEP and things that she should focus on moving forward today.  Pt is ready to d/c with HEP today    PT Treatment/Interventions ADLs/Self Care Home Management;Biofeedback;Cryotherapy;Electrical Stimulation;Moist Heat;Therapeutic activities;Therapeutic exercise;Neuromuscular re-education;Patient/family education;Taping;Passive range of motion;Dry needling;Manual techniques    PT Next Visit Plan d/c today    PT Home Exercise Plan Access Code: [email or web address], [email or web address]    Consulted and Agree with Plan of Care Patient             Patient will benefit from skilled therapeutic intervention in order to improve the following deficits and impairments:  Pain, Postural dysfunction, Impaired flexibility, Increased fascial restricitons, Decreased strength, Decreased coordination  Visit Diagnosis: Muscle weakness (generalized)  Abnormal posture     Problem  List Patient Active Problem List   Diagnosis Date Noted   Transaminitis 07/02/2019   Pregnant and not yet delivered in third trimester 07/02/2019   Moderate persistent asthma without complication 75/79/7282   Preterm uterine contractions 05/28/2019   GAD (generalized anxiety disorder) 12/08/2017   Obesity (BMI 30-39.9) 12/08/2017   Panic attack as reaction to stress 12/08/2017   Cigarette nicotine dependence without complication 08/23/5613   Flexural eczema 06/23/2017   Situational anxiety 05/13/2016    Jule Ser, PT 02/14/2021, 10:14 AM  [facility] @ [facility] [place], [place], [address] Phone: [phone]   Fax:  [phone]  Name: Nichole Curtis MRN: [id number] Date of Birth: Jan 07, 1996   PHYSICAL THERAPY DISCHARGE SUMMARY  Visits from Start of Care: 6  Current functional level related to goals / functional outcomes:   See above goals Remaining deficits: See above   Education / Equipment: HEP  Patient agrees to discharge. Patient goals were met. Patient is being discharged due to meeting the stated rehab goals.  Gustavus Bryant, PT 02/14/21 10:14 AM

## 2021-02-27 IMAGING — US US ABDOMEN LIMITED RUQ/ASCITES
1 series · 14 of 25 positions shown · non-contrast
Comparison: None.

CLINICAL DATA: Right upper quadrant pain

EXAM:
ULTRASOUND ABDOMEN LIMITED RIGHT UPPER QUADRANT

[Series 1: us abdomen limited ruq (liver/gb) · 14 of 46 slices shown]
[im 1/46]
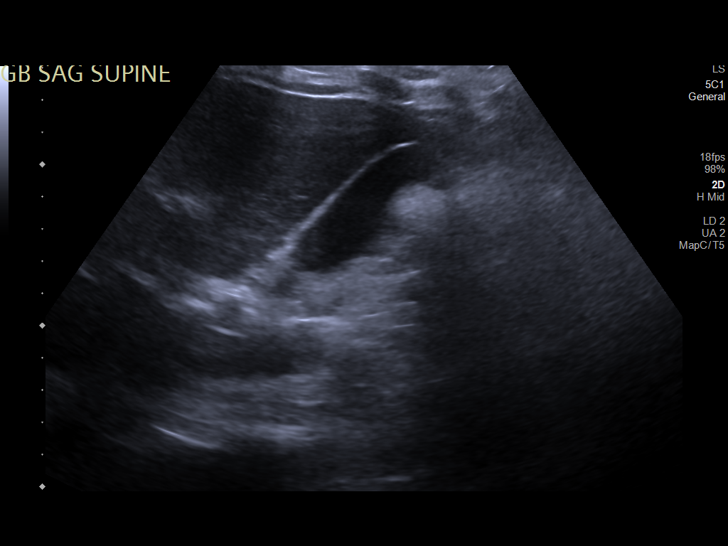
[im 4/46]
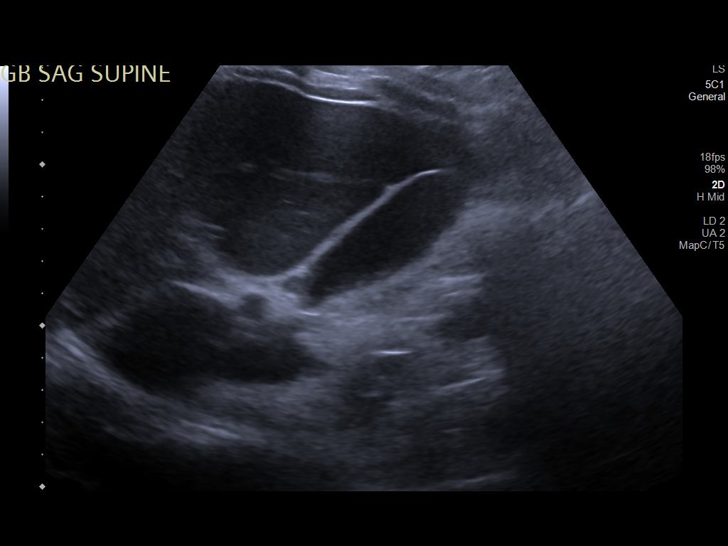
[im 8/46]
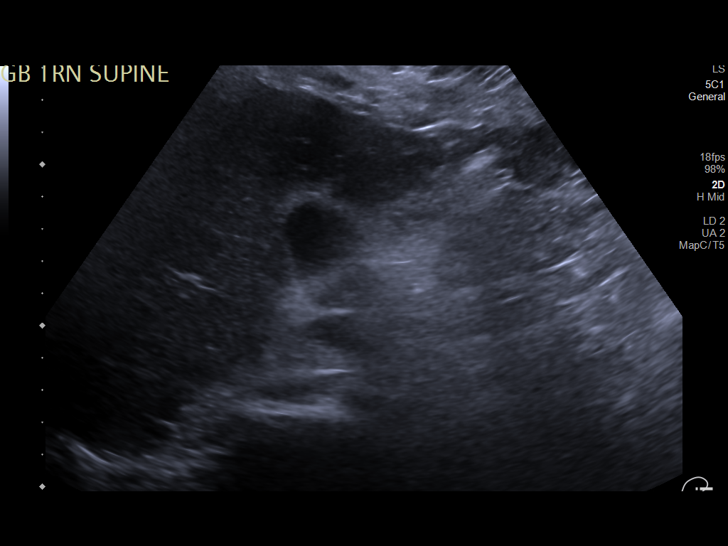
[im 12/46]
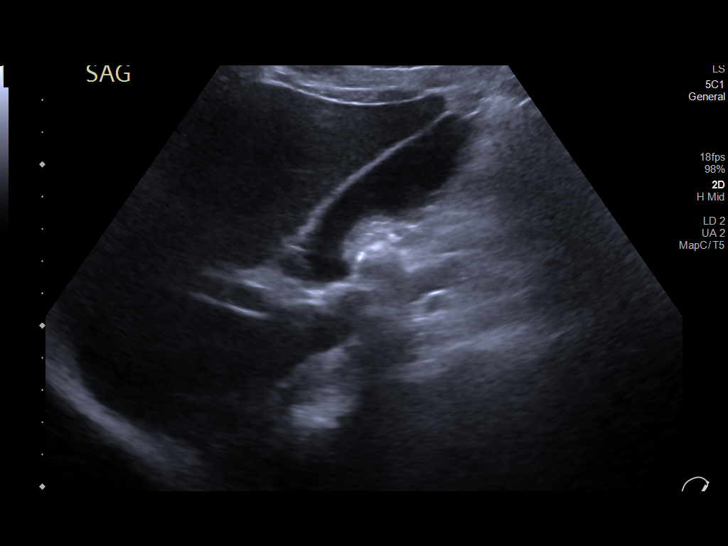
[im 16/46]
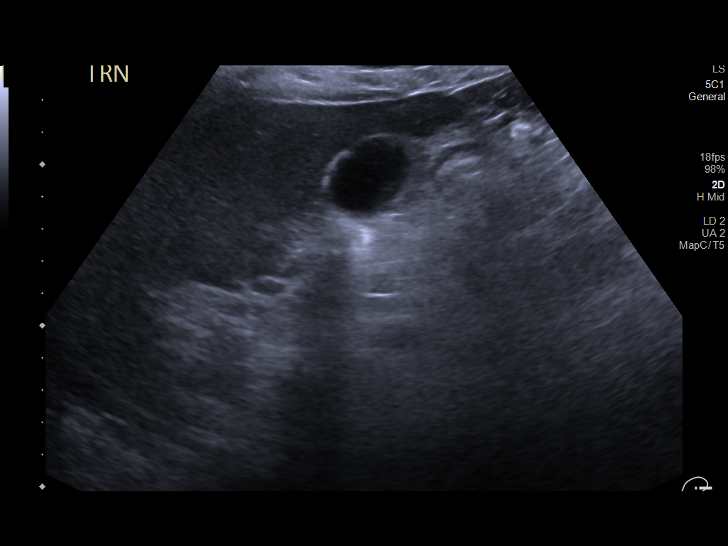
[im 17/46]
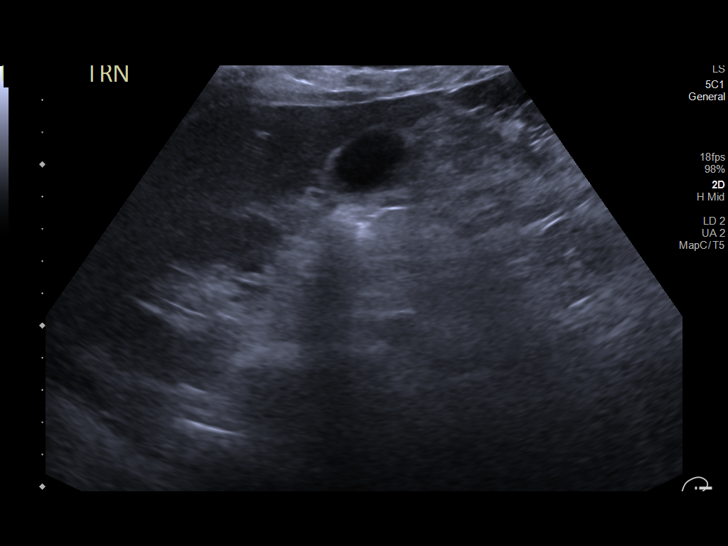
[im 21/46]
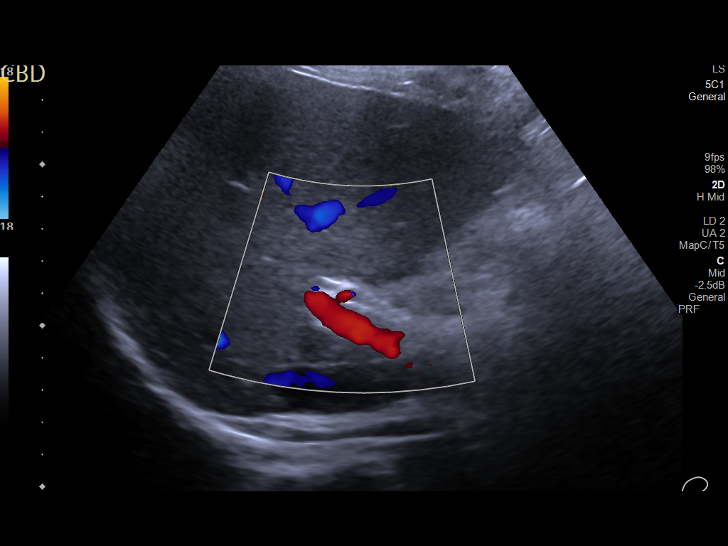
[im 25/46]
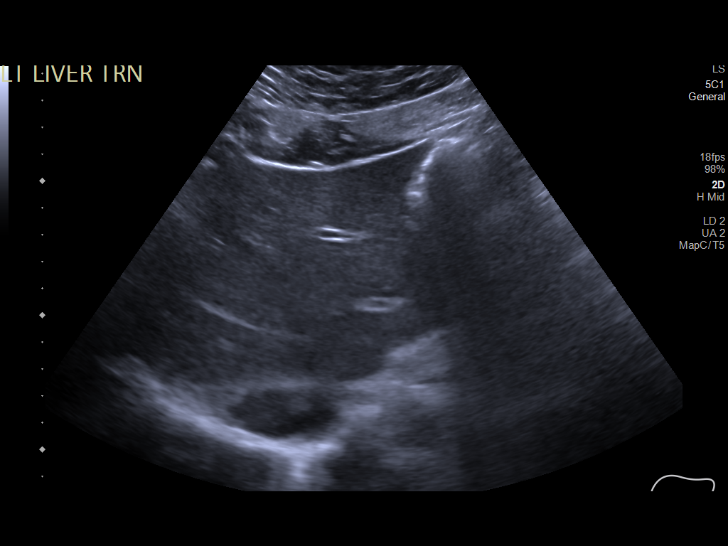
[im 29/46]
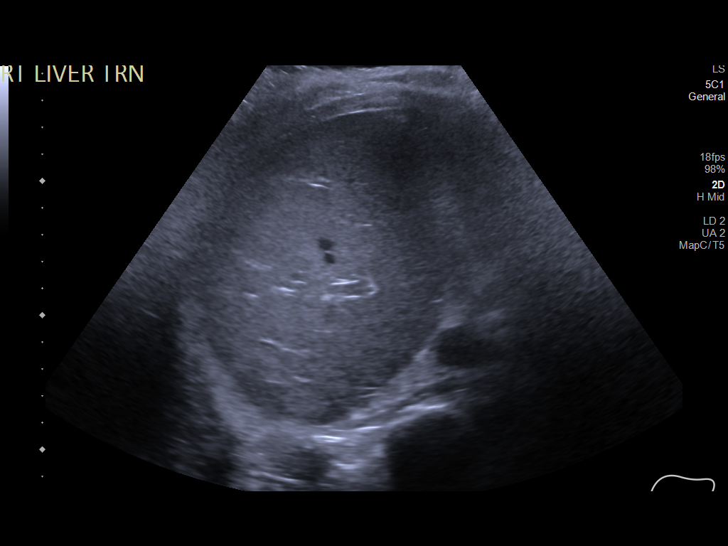
[im 31/46]
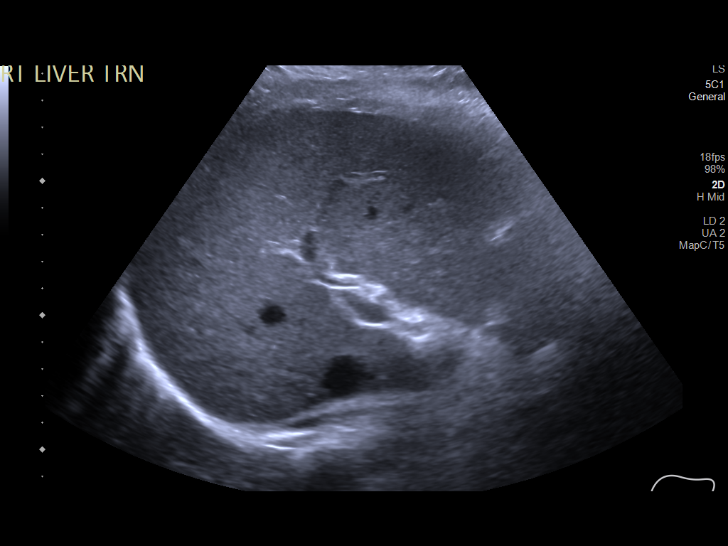
[im 34/46]
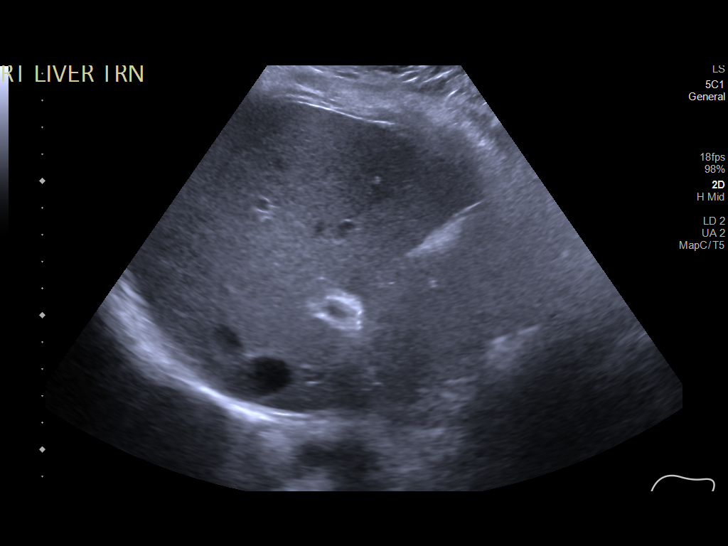
[im 38/46]
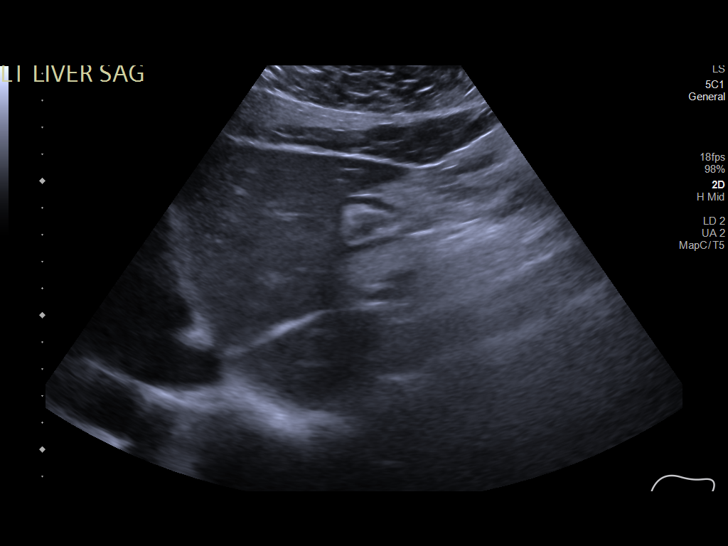
[im 42/46]
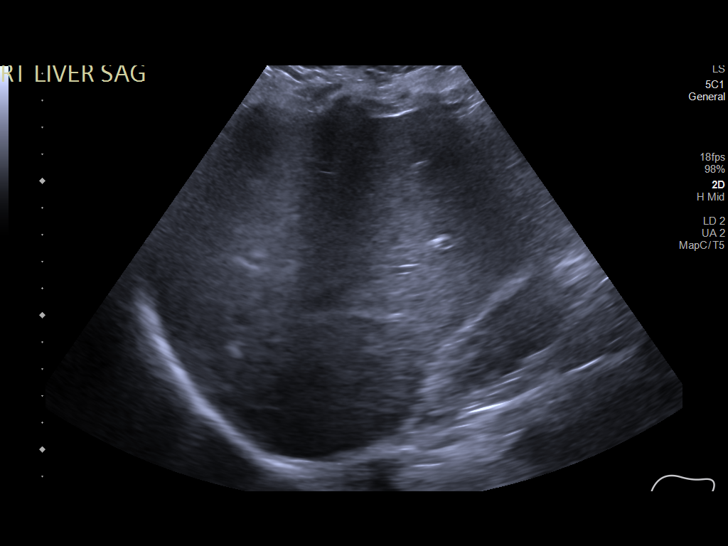
[im 46/46]
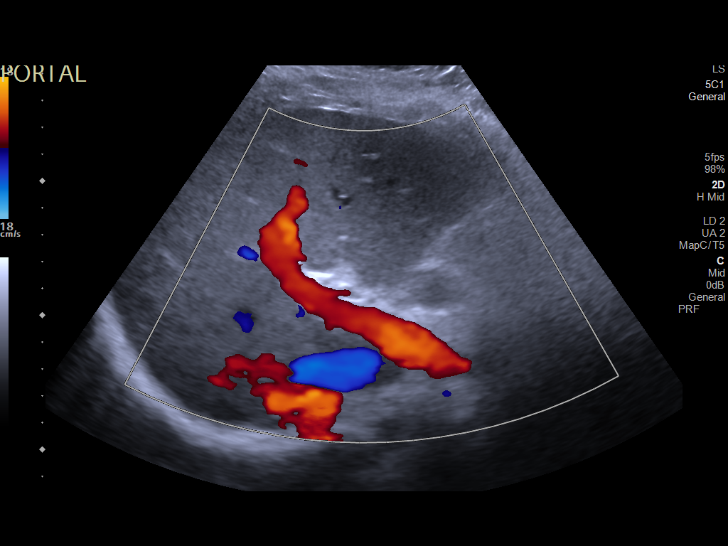

[14 of 25 positions shown; findings below may reference images not displayed]

FINDINGS: Gallbladder:

No gallstones or wall thickening visualized. No sonographic Murphy
sign noted by sonographer.

Common bile duct:

Diameter: 3 mm

Liver:

No focal lesion identified. Within normal limits in parenchymal
echogenicity. Portal vein is patent on color Doppler imaging with
normal direction of blood flow towards the liver.

Other: None.
IMPRESSION: Normal right upper quadrant ultrasound.

## 2021-03-15 ENCOUNTER — Encounter: Payer: Medicaid Other | Admitting: Physical Therapy

## 2021-03-20 ENCOUNTER — Encounter: Payer: Medicaid Other | Admitting: Physical Therapy

## 2021-03-27 ENCOUNTER — Encounter: Payer: Medicaid Other | Admitting: Physical Therapy

## 2021-04-03 ENCOUNTER — Encounter: Payer: Medicaid Other | Admitting: Physical Therapy

## 2021-04-10 ENCOUNTER — Encounter: Payer: Medicaid Other | Admitting: Physical Therapy

## 2021-06-25 ENCOUNTER — Other Ambulatory Visit: Payer: Self-pay | Admitting: Nurse Practitioner

## 2021-06-25 DIAGNOSIS — N644 Mastodynia: Secondary | ICD-10-CM

## 2021-07-05 ENCOUNTER — Ambulatory Visit
Admission: RE | Admit: 2021-07-05 | Discharge: 2021-07-05 | Disposition: A | Discharge status: Home / Self Care | Payer: Medicaid Other | Source: Ambulatory Visit | Attending: Nurse Practitioner | Admitting: Nurse Practitioner

## 2021-07-05 DIAGNOSIS — N644 Mastodynia: Secondary | ICD-10-CM
